# Patient Record
Sex: Male | Born: 1960 | Race: Black or African American | Hispanic: No | Marital: Married | Smoking: Never smoker
Health system: Southern US, Community
[De-identification: ages and names within clinical notes are randomized; demographics above are authoritative.]

## PROBLEM LIST (undated history)

## (undated) DIAGNOSIS — E119 Type 2 diabetes mellitus without complications: Secondary | ICD-10-CM

## (undated) DIAGNOSIS — G4733 Obstructive sleep apnea (adult) (pediatric): Secondary | ICD-10-CM

## (undated) DIAGNOSIS — A039 Shigellosis, unspecified: Secondary | ICD-10-CM

## (undated) DIAGNOSIS — K219 Gastro-esophageal reflux disease without esophagitis: Secondary | ICD-10-CM

## (undated) HISTORY — PX: OTHER SURGICAL HISTORY: SHX169

---

## 2001-07-23 ENCOUNTER — Encounter: Payer: Self-pay | Admitting: Internal Medicine

## 2001-07-23 ENCOUNTER — Emergency Department (HOSPITAL_COMMUNITY): Admission: EM | Admit: 2001-07-23 | Discharge: 2001-07-23 | Payer: Self-pay | Admitting: Emergency Medicine

## 2008-09-10 ENCOUNTER — Emergency Department (HOSPITAL_BASED_OUTPATIENT_CLINIC_OR_DEPARTMENT_OTHER): Admission: EM | Admit: 2008-09-10 | Discharge: 2008-09-10 | Payer: Self-pay | Admitting: Emergency Medicine

## 2010-04-30 ENCOUNTER — Encounter: Admission: RE | Admit: 2010-04-30 | Discharge: 2010-04-30 | Payer: Self-pay | Admitting: Internal Medicine

## 2011-04-02 NOTE — Consult Note (Signed)
[place]. [facility]  Patient:    Zachary Butler, Zachary Butler Visit Number: [id number] MRN: [id number]          Service Type: EMS Location: [facility] Attending Physician:  Osvaldo Human Dictated by:   Gloris Manchester. Lazarus Salines, M.D. Proc. Date: 07/23/01 Admit Date:  07/23/2001                            Consultation Report  CHIEF COMPLAINT:  Fish bone, right throat.  HISTORY OF PRESENT ILLNESS:  A 50 year old black male eating croaker last evening felt a bone lodge in his right throat.  He attempted to eat bread and similar to dislodge it without success.  Over the ensuing 24 hours, every time he swallows, it feels like something is poking him in his right throat.  He is a little bit scratchy in his voice but no breathing difficulty.  No blood.  No fever.  No prior similar events.  No recent upper respiratory infection or any reason to think he has a sore throat for other reasons.  He denies significant reflux.  He cannot feel it when he palpates externally but does point to the level of the hyoid, as describing where he thinks it is internally.  Slight referred neck and ear pain on the right side.  He is able to eat food and swallow liquids today without difficulty but does feel it, as above, every time he swallows.  PHYSICAL EXAMINATION:  GENERAL:  This is a healthy, cooperative, stocky, middle-aged black male in no distress.  The voice is slightly raspy.  He is not coughing or clearing his throat excessively during our interview.  Mental status is acute and appropriate.  He hears well in conversational speech.  Respiration is unlabored.  HEENT:  Head is atraumatic and neck supple.  Both ear canals are clear with aerated drums of normal configuration.  Cranial nerves intact.  Anterior nose showed slightly congested inferior turbinates but healthy mucosa and no active drainage.  Oral cavity shows teeth in good repair with some excess saliva. The oropharynx shows 2-3+  tonsils with slight cryptic material.  He has slight erythema of the posterior pharyngeal wall.  No asymmetry.  Soft palate normal. Could not see nasopharynx or hypopharynx secondary to gag.  NECK:  Muscular without adenopathy.  Normal laryngeal crepitus.  No tenderness with external palpation.  Following viscous Xylocaine topical anesthesia of the nose, the flexible laryngoscope was introduced.  The nasopharynx was clear with normal eustachian tori both sides.  Oropharynx clear with prominent tonsils and prominent base of tongue.  I could not easily see down into the valleculae, including with tongue protrusion.  The hypopharynx was fleshy.  Vocal cords are fully mobile with no erythema or asymmetry.  I see nothing in the endolarynx abnormal.  The epiglottis is clear.  The piriform sinuses are slightly narrow, consistent with his fleshy pharynx.  No pooling.  No fish bone or foreign body identified.  Attempted palpation to the base of tongue elicited a strong gag reflex and I was unable to get much farther back than the circumvallate papillae including no palpation of the tonsils.  X-ray:  A soft tissue lateral of the neck was read by the radiologist as consistent with foreign body but looks like they are once again observing the calcifications in the arytenoids and cricoid cartilage, typical with this gentlemans age.  IMPRESSION:  History consistent with a persistent fish bone foreign  body somewhere in the right oropharynx or hypopharynx.  The fact that he has a strong gag reflex, muscular tongue, and bulky lingual and faucial tonsil tissues could be hiding a fish bone easily that I could not detect here in the emergency room.  Following the numbing, the sensation of foreign body was slightly reduced, which may indicate that it is actually a mucosa scratch rather than a true foreign body.  Discussed the options with him.  He has had liquid to drink here within the last half and  hour, and so would have to wait several hours for n.p.o. status before considering for general anesthesia for direct laryngoscopy and esophagoscopy looking for a foreign body.  I think it is reasonable to give this some additional time to see if it settles down spontaneously.  It is now a quarter of 10 on Sunday night.  I will give him until 7 oclock Monday morning, at which point he will be fully n.p.o.  He will call me and, if he is still having a persistent sensation, then we will plan endoscopy tomorrow.  He will call me even if he is feeling better, so that we know that we have some closure on this issue.  If he is feeling well, I do not need to see him back. If he is still having trouble, we will take care of him. Dictated by:   Gloris Manchester. Lazarus Salines, M.D. Attending Physician:  Osvaldo Human DD:  07/23/01 TD:  07/24/01 Job: [id number]

## 2011-08-17 LAB — HIV RAPID SCREEN (BLD OR BODY FLD EXPOSURE): Rapid HIV Screen: NEGATIVE — AB

## 2011-08-17 LAB — HEPATITIS C ANTIBODY, REFLEX: HCV Ab: NEGATIVE

## 2011-08-17 LAB — HEPATITIS B SURFACE ANTIGEN: Hepatitis B Surface Ag: NEGATIVE

## 2014-08-09 ENCOUNTER — Ambulatory Visit: Payer: PRIVATE HEALTH INSURANCE

## 2014-08-09 ENCOUNTER — Other Ambulatory Visit: Payer: Self-pay | Admitting: Occupational Medicine

## 2014-08-09 ENCOUNTER — Ambulatory Visit
Admission: RE | Admit: 2014-08-09 | Discharge: 2014-08-09 | Disposition: A | Discharge status: Home / Self Care | Payer: PRIVATE HEALTH INSURANCE | Source: Ambulatory Visit | Attending: Occupational Medicine | Admitting: Occupational Medicine

## 2014-08-09 DIAGNOSIS — Z0000 Encounter for general adult medical examination without abnormal findings: Secondary | ICD-10-CM

## 2015-09-19 ENCOUNTER — Encounter (HOSPITAL_BASED_OUTPATIENT_CLINIC_OR_DEPARTMENT_OTHER): Payer: Self-pay

## 2015-09-19 ENCOUNTER — Observation Stay (HOSPITAL_BASED_OUTPATIENT_CLINIC_OR_DEPARTMENT_OTHER)
Admission: EM | Admit: 2015-09-19 | Discharge: 2015-09-23 | Disposition: A | Discharge status: Home / Self Care | Payer: Managed Care, Other (non HMO) | Attending: Surgery | Admitting: Surgery

## 2015-09-19 ENCOUNTER — Emergency Department (HOSPITAL_BASED_OUTPATIENT_CLINIC_OR_DEPARTMENT_OTHER): Payer: Managed Care, Other (non HMO)

## 2015-09-19 DIAGNOSIS — Z79899 Other long term (current) drug therapy: Secondary | ICD-10-CM | POA: Diagnosis not present

## 2015-09-19 DIAGNOSIS — R1084 Generalized abdominal pain: Secondary | ICD-10-CM | POA: Diagnosis present

## 2015-09-19 DIAGNOSIS — A033 Shigellosis due to Shigella sonnei: Secondary | ICD-10-CM | POA: Diagnosis not present

## 2015-09-19 DIAGNOSIS — R197 Diarrhea, unspecified: Secondary | ICD-10-CM

## 2015-09-19 DIAGNOSIS — G4733 Obstructive sleep apnea (adult) (pediatric): Secondary | ICD-10-CM | POA: Insufficient documentation

## 2015-09-19 DIAGNOSIS — K3589 Other acute appendicitis without perforation or gangrene: Secondary | ICD-10-CM

## 2015-09-19 DIAGNOSIS — R10813 Right lower quadrant abdominal tenderness: Secondary | ICD-10-CM

## 2015-09-19 DIAGNOSIS — K219 Gastro-esophageal reflux disease without esophagitis: Secondary | ICD-10-CM | POA: Insufficient documentation

## 2015-09-19 DIAGNOSIS — K529 Noninfective gastroenteritis and colitis, unspecified: Secondary | ICD-10-CM | POA: Diagnosis present

## 2015-09-19 HISTORY — DX: Gastro-esophageal reflux disease without esophagitis: K21.9

## 2015-09-19 HISTORY — DX: Obstructive sleep apnea (adult) (pediatric): G47.33

## 2015-09-19 LAB — CBC WITH DIFFERENTIAL/PLATELET
BASOS ABS: 0 10*3/uL (ref 0.0–0.1)
BASOS PCT: 0 %
Eosinophils Absolute: 0.1 10*3/uL (ref 0.0–0.7)
Eosinophils Relative: 1 %
HEMATOCRIT: 40.4 % (ref 39.0–52.0)
Hemoglobin: 13.5 g/dL (ref 13.0–17.0)
Lymphocytes Relative: 26 %
Lymphs Abs: 1.3 10*3/uL (ref 0.7–4.0)
MCH: 27.4 pg (ref 26.0–34.0)
MCHC: 33.4 g/dL (ref 30.0–36.0)
MCV: 82.1 fL (ref 78.0–100.0)
MONO ABS: 0.8 10*3/uL (ref 0.1–1.0)
Monocytes Relative: 17 %
NEUTROS ABS: 2.7 10*3/uL (ref 1.7–7.7)
Neutrophils Relative %: 56 %
Platelets: 160 10*3/uL (ref 150–400)
RBC: 4.92 MIL/uL (ref 4.22–5.81)
RDW: 13.9 % (ref 11.5–15.5)
WBC: 4.9 10*3/uL (ref 4.0–10.5)

## 2015-09-19 LAB — COMPREHENSIVE METABOLIC PANEL
ALBUMIN: 3.7 g/dL (ref 3.5–5.0)
ALT: 33 U/L (ref 17–63)
ANION GAP: 7 (ref 5–15)
AST: 20 U/L (ref 15–41)
Alkaline Phosphatase: 41 U/L (ref 38–126)
BILIRUBIN TOTAL: 0.6 mg/dL (ref 0.3–1.2)
BUN: 9 mg/dL (ref 6–20)
CO2: 28 mmol/L (ref 22–32)
Calcium: 8.6 mg/dL — ABNORMAL LOW (ref 8.9–10.3)
Chloride: 102 mmol/L (ref 101–111)
Creatinine, Ser: 0.88 mg/dL (ref 0.61–1.24)
GFR calc non Af Amer: >60 mL/min (ref >60)
GLUCOSE: 116 mg/dL — AB (ref 65–99)
Potassium: 3.3 mmol/L — ABNORMAL LOW (ref 3.5–5.1)
Sodium: 137 mmol/L (ref 135–145)
TOTAL PROTEIN: 7.3 g/dL (ref 6.5–8.1)

## 2015-09-19 LAB — C DIFFICILE QUICK SCREEN W PCR REFLEX
C DIFFICLE (CDIFF) ANTIGEN: UNDETERMINED — AB
C Diff toxin: UNDETERMINED — AB

## 2015-09-19 LAB — CLOSTRIDIUM DIFFICILE BY PCR: CDIFFPCR: NEGATIVE

## 2015-09-19 LAB — LIPASE, BLOOD: Lipase: 22 U/L (ref 11–51)

## 2015-09-19 MED ORDER — IOHEXOL 300 MG/ML  SOLN
100.0000 mL | Freq: Once | INTRAMUSCULAR | Status: AC | PRN
  Administered 2015-09-19: 100 mL via INTRAVENOUS

## 2015-09-19 MED ORDER — ONDANSETRON HCL 4 MG/2ML IJ SOLN
4.0000 mg | Freq: Once | INTRAMUSCULAR | Status: AC
  Administered 2015-09-19: 4 mg via INTRAVENOUS
  Filled 2015-09-19: qty 2

## 2015-09-19 MED ORDER — POTASSIUM CHLORIDE IN NACL 40-0.9 MEQ/L-% IV SOLN
INTRAVENOUS | Status: DC
  Administered 2015-09-19 – 2015-09-20 (×2): 125 mL/h via INTRAVENOUS
  Filled 2015-09-19 (×4): qty 1000

## 2015-09-19 MED ORDER — IBUPROFEN 200 MG PO TABS
600.0000 mg | ORAL_TABLET | Freq: Four times a day (QID) | ORAL | Status: DC | PRN
  Administered 2015-09-20 – 2015-09-23 (×8): 600 mg via ORAL
  Filled 2015-09-19 (×8): qty 3

## 2015-09-19 MED ORDER — ONDANSETRON HCL 4 MG/2ML IJ SOLN: 4.0000 mg | Freq: Four times a day (QID) | INTRAMUSCULAR | Status: DC | PRN

## 2015-09-19 MED ORDER — SODIUM CHLORIDE 0.9 % IV BOLUS (SEPSIS)
1000.0000 mL | Freq: Once | INTRAVENOUS | Status: AC
  Administered 2015-09-19: 1000 mL via INTRAVENOUS

## 2015-09-19 MED ORDER — DIPHENHYDRAMINE HCL 50 MG/ML IJ SOLN: 25.0000 mg | Freq: Four times a day (QID) | INTRAMUSCULAR | Status: DC | PRN

## 2015-09-19 MED ORDER — MORPHINE SULFATE (PF) 4 MG/ML IV SOLN
4.0000 mg | Freq: Once | INTRAVENOUS | Status: AC
  Administered 2015-09-19: 4 mg via INTRAVENOUS
  Filled 2015-09-19: qty 1

## 2015-09-19 MED ORDER — PIPERACILLIN-TAZOBACTAM 3.375 G IVPB 30 MIN
3.3750 g | Freq: Once | INTRAVENOUS | Status: AC
  Administered 2015-09-19: 3.375 g via INTRAVENOUS
  Filled 2015-09-19 (×2): qty 50

## 2015-09-19 MED ORDER — HEPARIN SODIUM (PORCINE) 5000 UNIT/ML IJ SOLN
5000.0000 [IU] | Freq: Three times a day (TID) | INTRAMUSCULAR | Status: AC
  Administered 2015-09-19 (×2): 5000 [IU] via SUBCUTANEOUS
  Filled 2015-09-19 (×2): qty 1

## 2015-09-19 MED ORDER — DIPHENHYDRAMINE HCL 25 MG PO CAPS: 25.0000 mg | ORAL_CAPSULE | Freq: Four times a day (QID) | ORAL | Status: DC | PRN

## 2015-09-19 MED ORDER — IOHEXOL 300 MG/ML  SOLN
25.0000 mL | Freq: Once | INTRAMUSCULAR | Status: AC | PRN
  Administered 2015-09-19: 25 mL via ORAL

## 2015-09-19 MED ORDER — ONDANSETRON 4 MG PO TBDP
4.0000 mg | ORAL_TABLET | Freq: Four times a day (QID) | ORAL | Status: DC | PRN
  Administered 2015-09-21 (×3): 4 mg via ORAL
  Filled 2015-09-19 (×3): qty 1

## 2015-09-19 MED ORDER — MORPHINE SULFATE (PF) 2 MG/ML IV SOLN
1.0000 mg | INTRAVENOUS | Status: DC | PRN
Start: 2015-09-19 — End: 2015-09-20
  Administered 2015-09-19: 2 mg via INTRAVENOUS
  Filled 2015-09-19: qty 1

## 2015-09-19 NOTE — ED Notes (Signed)
Pt's wife came out to nurse's desk asking how much longer before surgeon to see pt.   [place] and [place].  Surgeon is currently in OR then has one case before this patient.  Andrey CampanileSandy believes that MD will come to see patient after current case and before he starts that case before this pt, approx 40 mins.  Made pt's wife aware.

## 2015-09-19 NOTE — ED Notes (Signed)
Bed: WA17 Expected date:  Expected time:  Means of arrival:  Comments: Ems  

## 2015-09-19 NOTE — ED Notes (Signed)
Pt reports Tuesday night, he developed generalized abdominal cramping, nausea, diarrhea, headache, fever. Denies sick contacts.

## 2015-09-19 NOTE — ED Notes (Signed)
Pt is aware of need for stool sample. 

## 2015-09-19 NOTE — H&P (Signed)
Zachary Butler is an 54 y.o. male.   Chief Complaint:  Generalized abdominal cramping, nausea, diarrhea, headache and fever symptoms started on 09/16/15. HPI:  Pt presents to [facility] with what they thought was an gastroenteritis, no one else in the family is sick.  He has been this way since 09/16/15.   No fever, VSS.  WBC is normal 4.9, no left shift. K+ is low, but otherwise normal CMP.  CT scan shows:  The appendix originates from the medial aspect of the cecum and demonstrates thickening with maximal proximal caliber of 11 mm and tapering down to approximately 8 mm distally. No significant surrounding inflammatory changes or evidence of focal abscess. No extraluminal air identified. Findings likely represent early appendicitis.  He is transferred to [facility] for further evaluation and treatment. Currently he feels fine, but is still tender RLQ.  Past Medical History  Diagnosis Date  . OSA (obstructive sleep apnea)   . GERD (gastroesophageal reflux disease)     History reviewed. No pertinent past surgical history.  History reviewed. No pertinent family history. Social History:  reports that he has never smoked. He does not have any smokeless tobacco history on file. He reports that he does not drink alcohol or use illicit drugs.  Allergies: No Known Allergies  Prior to Admission medications   Medication Sig Start Date End Date Taking? Authorizing Provider  OMEPRAZOLE PO Take by mouth.   Yes Historical Provider, MD     Results for orders placed or performed during the hospital encounter of 09/19/15 (from the past 48 hour(s))  Comprehensive metabolic panel     Status: Abnormal   Collection Time: 09/19/15  9:35 AM  Result Value Ref Range   Sodium 137 135 - 145 mmol/L   Potassium 3.3 (L) 3.5 - 5.1 mmol/L   Chloride 102 101 - 111 mmol/L   CO2 28 22 - 32 mmol/L   Glucose, Bld 116 (H) 65 - 99 mg/dL   BUN 9 6 - 20 mg/dL   Creatinine, Ser 0.88 0.61 - 1.24 mg/dL   Calcium 8.6 (L) 8.9 - 10.3 mg/dL    Total Protein 7.3 6.5 - 8.1 g/dL   Albumin 3.7 3.5 - 5.0 g/dL   AST 20 15 - 41 U/L   ALT 33 17 - 63 U/L   Alkaline Phosphatase 41 38 - 126 U/L   Total Bilirubin 0.6 0.3 - 1.2 mg/dL   GFR calc non Af Amer >60 >60 mL/min   GFR calc Af Amer >60 >60 mL/min    Comment: (NOTE) The eGFR has been calculated using the CKD EPI equation. This calculation has not been validated in all clinical situations. eGFR's persistently <60 mL/min signify possible Chronic Kidney Disease.    Anion gap 7 5 - 15  CBC WITH DIFFERENTIAL     Status: None   Collection Time: 09/19/15  9:35 AM  Result Value Ref Range   WBC 4.9 4.0 - 10.5 K/uL   RBC 4.92 4.22 - 5.81 MIL/uL   Hemoglobin 13.5 13.0 - 17.0 g/dL   HCT 40.4 39.0 - 52.0 %   MCV 82.1 78.0 - 100.0 fL   MCH 27.4 26.0 - 34.0 pg   MCHC 33.4 30.0 - 36.0 g/dL   RDW 13.9 11.5 - 15.5 %   Platelets 160 150 - 400 K/uL   Neutrophils Relative % 56 %   Neutro Abs 2.7 1.7 - 7.7 K/uL   Lymphocytes Relative 26 %   Lymphs Abs 1.3 0.7 - 4.0  K/uL   Monocytes Relative 17 %   Monocytes Absolute 0.8 0.1 - 1.0 K/uL   Eosinophils Relative 1 %   Eosinophils Absolute 0.1 0.0 - 0.7 K/uL   Basophils Relative 0 %   Basophils Absolute 0.0 0.0 - 0.1 K/uL  Lipase, blood     Status: None   Collection Time: 09/19/15  9:35 AM  Result Value Ref Range   Lipase 22 11 - 51 U/L   Ct Abdomen Pelvis W Contrast  09/19/2015  CLINICAL DATA:  Right lower quadrant abdominal pain for 3 days with nausea and diarrhea. EXAM: CT ABDOMEN AND PELVIS WITH CONTRAST TECHNIQUE: Multidetector CT imaging of the abdomen and pelvis was performed using the standard protocol following bolus administration of intravenous contrast. CONTRAST:  43m OMNIPAQUE IOHEXOL 300 MG/ML SOLN, 1041mOMNIPAQUE IOHEXOL 300 MG/ML SOLN COMPARISON:  None. FINDINGS: The appendix originates from the medial aspect of the cecum and demonstrates thickening with maximal proximal caliber of 11 mm and tapering down to approximately 8  mm distally. No significant surrounding inflammatory changes or evidence of focal abscess. No extraluminal air identified. Findings likely represent early appendicitis. No bowel obstruction identified. No free air. The liver, gallbladder, pancreas, spleen, adrenal glands and kidneys are within normal limits. No masses, enlarged lymph nodes or hernias are seen. No vascular abnormalities identified. Bony structures are unremarkable. Visualized lung bases are unremarkable and show mild atelectasis in the right middle lobe. IMPRESSION: Enlarged appendix measuring 11 mm in greatest caliber. Although no significant surrounding inflammatory changes are seen, findings likely represent early appendicitis based on abnormal caliber of the appendix. Electronically Signed   By: GlAletta Edouard.D.   On: 09/19/2015 11:00    Review of Systems  Constitutional: Positive for weight loss (not sure how much). Negative for fever, chills, malaise/fatigue and diaphoresis.       He felt hot, but never took temp.  HENT: Negative.   Eyes: Negative.   Respiratory: Negative.        [facility]  Cardiovascular: Negative.   Gastrointestinal: Positive for heartburn, nausea, vomiting, abdominal pain (Pain is in RLQ) and diarrhea. Negative for constipation, blood in stool and melena.  Genitourinary: Negative.   Musculoskeletal: Negative.   Skin: Negative.   Neurological: Negative.  Negative for weakness.  Endo/Heme/Allergies: Negative.   Psychiatric/Behavioral: Negative.        Veteran being evaluated for PTSD like issues.    Blood pressure 132/79, pulse 68, temperature 99.2 F (37.3 C), temperature source Oral, resp. rate 18, height 6' 1"  (1.854 m), weight 102.059 kg (225 lb), SpO2 97 %. Physical Exam  Constitutional: He is oriented to person, place, and time. He appears well-developed and well-nourished. No distress.  HENT:  Head: Normocephalic and atraumatic.  Nose: Nose normal.  Eyes:  Conjunctivae and EOM are normal. Right eye exhibits no discharge. Left eye exhibits no discharge. No scleral icterus.  Neck: Normal range of motion. Neck supple. No JVD present. No tracheal deviation present. No thyromegaly present.  Cardiovascular: Normal rate, regular rhythm, normal heart sounds and intact distal pulses.   No murmur heard. Respiratory: Effort normal and breath sounds normal. No respiratory distress. He has no wheezes. He has no rales. He exhibits no tenderness.  GI: Soft. Bowel sounds are normal. He exhibits no distension and no mass. There is tenderness (RLQ). There is no rebound and no guarding.  Musculoskeletal: He exhibits no edema or tenderness.  Lymphadenopathy:    He has no cervical  adenopathy.  Neurological: He is alert and oriented to person, place, and time. No cranial nerve deficit.  Skin: Skin is warm and dry. No rash noted. He is not diaphoretic. No erythema. No pallor.  Psychiatric: He has a normal mood and affect. His behavior is normal. Judgment and thought content normal.     Assessment/Plan Appendicitis vs gastroenteritis OSA GERD  PLAN:  Admit to observation, hydrate overnight, clears for now, NPO after MN. Reevaluate in AM, for possible appendectomy.  I have ordered C diff and stool cultures because he is still having diarrhea.  He had one dose of Zosyn, but I have not ordered more at this point until I have a better diagnosis.  Kenyonna Micek 09/19/2015, 11:26 AM

## 2015-09-19 NOTE — ED Provider Notes (Signed)
CSN: [id number]     Arrival date & time 09/19/15  4098 History   First MD Initiated Contact with Patient 09/19/15 [phone]     Chief Complaint  Patient presents with  . Abdominal Pain     (Consider location/radiation/quality/duration/timing/severity/associated sxs/prior Treatment) HPI Comments: Alka seltzer and tums put to sleep, did not help   Patient is a 54 y.o. male presenting with abdominal pain.  Abdominal Pain Pain location:  Generalized Pain quality: cramping   Pain radiates to:  Does not radiate Pain severity:  Severe Onset quality:  Gradual Duration:  4 days Timing:  Constant Progression:  Worsening Chronicity:  New Context: not sick contacts   Associated symptoms: diarrhea (every hour or more since tuesday, no recent abx ), fatigue, fever (subjective) and nausea (improved)   Associated symptoms: no chest pain, no constipation, no cough, no dysuria, no melena, no shortness of breath, no sore throat and no vomiting   Risk factors comment:  No recent abx   Past Medical History  Diagnosis Date  . OSA (obstructive sleep apnea)   . GERD (gastroesophageal reflux disease)    History reviewed. No pertinent past surgical history. History reviewed. No pertinent family history. Social History  Substance Use Topics  . Smoking status: Never Smoker   . Smokeless tobacco: None  . Alcohol Use: No    Review of Systems  Constitutional: Positive for fever (subjective) and fatigue. Negative for appetite change.  HENT: Negative for sore throat.   Eyes: Negative for visual disturbance.  Respiratory: Negative for cough and shortness of breath.   Cardiovascular: Negative for chest pain.  Gastrointestinal: Positive for nausea (improved), abdominal pain and diarrhea (every hour or more since tuesday, no recent abx ). Negative for vomiting, constipation and melena.  Genitourinary: Negative for dysuria and difficulty urinating.  Musculoskeletal: Negative for back pain and neck  stiffness.  Skin: Negative for rash.  Neurological: Positive for headaches. Negative for syncope, speech difficulty, weakness and numbness.      Allergies  Review of patient's allergies indicates no known allergies.  Home Medications   Prior to Admission medications   Medication Sig Start Date End Date Taking? Authorizing Provider  cyclobenzaprine (FLEXERIL) 10 MG tablet Take 10 mg by mouth 3 (three) times daily as needed for muscle spasms.   Yes Historical Provider, MD  ergocalciferol (VITAMIN D2) 50000 UNITS capsule Take 50,000 Units by mouth once a week. Tuesday   Yes Historical Provider, MD  methocarbamol (ROBAXIN) 500 MG tablet Take 1 tablet by mouth daily as needed. spasm 07/30/15  Yes Historical Provider, MD  Phenyleph-Doxylamine-DM-APAP (ALKA SELTZER PLUS PO) Take 1 tablet by mouth daily as needed (stomach discomfort).   Yes Historical Provider, MD   BP 123/75 mmHg  Pulse 53  Temp(Src) 98.1 F (36.7 C) (Oral)  Resp 18  Ht  (1.854 m)  Wt 225 lb (102.059 kg)  BMI 29.69 kg/m2  SpO2 98% Physical Exam  Constitutional: He is oriented to person, place, and time. He appears well-developed and well-nourished. No distress.  HENT:  Head: Normocephalic and atraumatic.  Eyes: Conjunctivae and EOM are normal.  Neck: Normal range of motion.  Cardiovascular: Normal rate, regular rhythm, normal heart sounds and intact distal pulses.  Exam reveals no gallop and no friction rub.   No murmur heard. Pulmonary/Chest: Effort normal and breath sounds normal. No respiratory distress. He has no wheezes. He has no rales.  Abdominal: Soft. He exhibits no distension. There is tenderness (RLQ). There is no guarding.  Musculoskeletal: He exhibits no edema.  Neurological: He is alert and oriented to person, place, and time.  Skin: Skin is warm and dry. He is not diaphoretic.  Nursing note and vitals reviewed.   ED Course  Procedures (including critical care time) Labs Review Labs Reviewed   C DIFFICILE QUICK SCREEN W PCR REFLEX - Abnormal; Notable for the following:    C Diff antigen INDETERMINATE (*)    C Diff toxin INDETERMINATE (*)    All other components within normal limits  COMPREHENSIVE METABOLIC PANEL - Abnormal; Notable for the following:    Potassium 3.3 (*)    Glucose, Bld 116 (*)    Calcium 8.6 (*)    All other components within normal limits  COMPREHENSIVE METABOLIC PANEL - Abnormal; Notable for the following:    Glucose, Bld 106 (*)    Calcium 8.6 (*)    Total Protein 6.4 (*)    Albumin 3.2 (*)    Alkaline Phosphatase 36 (*)    All other components within normal limits  CBC - Abnormal; Notable for the following:    Hemoglobin 11.9 (*)    HCT 36.2 (*)    Platelets 138 (*)    All other components within normal limits  CLOSTRIDIUM DIFFICILE BY PCR  STOOL CULTURE  CBC WITH DIFFERENTIAL/PLATELET  LIPASE, BLOOD    Imaging Review Ct Abdomen Pelvis W Contrast  09/19/2015  CLINICAL DATA:  Right lower quadrant abdominal pain for 3 days with nausea and diarrhea. EXAM: CT ABDOMEN AND PELVIS WITH CONTRAST TECHNIQUE: Multidetector CT imaging of the abdomen and pelvis was performed using the standard protocol following bolus administration of intravenous contrast. CONTRAST:  25mL OMNIPAQUE IOHEXOL 300 MG/ML SOLN, 100mL OMNIPAQUE IOHEXOL 300 MG/ML SOLN COMPARISON:  None. FINDINGS: The appendix originates from the medial aspect of the cecum and demonstrates thickening with maximal proximal caliber of 11 mm and tapering down to approximately 8 mm distally. No significant surrounding inflammatory changes or evidence of focal abscess. No extraluminal air identified. Findings likely represent early appendicitis. No bowel obstruction identified. No free air. The liver, gallbladder, pancreas, spleen, adrenal glands and kidneys are within normal limits. No masses, enlarged lymph nodes or hernias are seen. No vascular abnormalities identified. Bony structures are unremarkable.  Visualized lung bases are unremarkable and show mild atelectasis in the right middle lobe. IMPRESSION: Enlarged appendix measuring 11 mm in greatest caliber. Although no significant surrounding inflammatory changes are seen, findings likely represent early appendicitis based on abnormal caliber of the appendix. Electronically Signed   By: Irish LackGlenn  Yamagata M.D.   On: 09/19/2015 11:00   I have personally reviewed and evaluated these images and lab results as part of my medical decision-making.   EKG Interpretation None      MDM   Final diagnoses:  Diarrhea, unspecified type  Other acute appendicitis, enlarged appendix 11mm  Right lower quadrant abdominal tenderness   54yo male with history of GERD, OSA presents with concern for diarrhea and abdominal pain.  History most consistent with likely viral gastroenteritis, however patient with specific RLQ tenderness on exam and CT abd/pelvis ordered to evaluate for appendicitis.  CT shows enlarged appendix, however no other inflammatory findings. Given this RLQ tenderness and enlarged appendix, pt given zosyn and surgery consulted for possible early appendicitis. [facility] and care.    Alvira MondayErin Addie Cederberg, MD 09/20/15 1252

## 2015-09-20 ENCOUNTER — Encounter (HOSPITAL_COMMUNITY)
Admission: EM | Disposition: A | Discharge status: Home / Self Care | Payer: Self-pay | Source: Home / Self Care | Attending: Emergency Medicine

## 2015-09-20 LAB — CBC
HCT: 36.2 % — ABNORMAL LOW (ref 39.0–52.0)
Hemoglobin: 11.9 g/dL — ABNORMAL LOW (ref 13.0–17.0)
MCH: 27.2 pg (ref 26.0–34.0)
MCHC: 32.9 g/dL (ref 30.0–36.0)
MCV: 82.8 fL (ref 78.0–100.0)
PLATELETS: 138 10*3/uL — AB (ref 150–400)
RBC: 4.37 MIL/uL (ref 4.22–5.81)
RDW: 13.6 % (ref 11.5–15.5)
WBC: 4.7 10*3/uL (ref 4.0–10.5)

## 2015-09-20 LAB — COMPREHENSIVE METABOLIC PANEL
ALBUMIN: 3.2 g/dL — AB (ref 3.5–5.0)
ALT: 27 U/L (ref 17–63)
AST: 16 U/L (ref 15–41)
Alkaline Phosphatase: 36 U/L — ABNORMAL LOW (ref 38–126)
Anion gap: 6 (ref 5–15)
BILIRUBIN TOTAL: 0.5 mg/dL (ref 0.3–1.2)
BUN: 6 mg/dL (ref 6–20)
CO2: 27 mmol/L (ref 22–32)
CREATININE: 0.74 mg/dL (ref 0.61–1.24)
Calcium: 8.6 mg/dL — ABNORMAL LOW (ref 8.9–10.3)
Chloride: 107 mmol/L (ref 101–111)
GFR calc Af Amer: >60 mL/min (ref >60)
GFR calc non Af Amer: >60 mL/min (ref >60)
Glucose, Bld: 106 mg/dL — ABNORMAL HIGH (ref 65–99)
POTASSIUM: 3.9 mmol/L (ref 3.5–5.1)
Sodium: 140 mmol/L (ref 135–145)
TOTAL PROTEIN: 6.4 g/dL — AB (ref 6.5–8.1)

## 2015-09-20 SURGERY — APPENDECTOMY, LAPAROSCOPIC
Anesthesia: General

## 2015-09-20 MED ORDER — MAGIC MOUTHWASH
15.0000 mL | Freq: Four times a day (QID) | ORAL | Status: DC | PRN
  Filled 2015-09-20: qty 15

## 2015-09-20 MED ORDER — BISMUTH SUBSALICYLATE 262 MG/15ML PO SUSP
30.0000 mL | Freq: Two times a day (BID) | ORAL | Status: DC
  Administered 2015-09-21: 30 mL via ORAL
  Filled 2015-09-20: qty 118

## 2015-09-20 MED ORDER — SACCHAROMYCES BOULARDII 250 MG PO CAPS
250.0000 mg | ORAL_CAPSULE | Freq: Two times a day (BID) | ORAL | Status: DC
  Administered 2015-09-20 – 2015-09-23 (×7): 250 mg via ORAL
  Filled 2015-09-20 (×9): qty 1

## 2015-09-20 MED ORDER — LIP MEDEX EX OINT
1.0000 "application " | TOPICAL_OINTMENT | Freq: Two times a day (BID) | CUTANEOUS | Status: DC
  Administered 2015-09-20 – 2015-09-23 (×6): 1 via TOPICAL
  Filled 2015-09-20: qty 7

## 2015-09-20 MED ORDER — PROMETHAZINE HCL 25 MG/ML IJ SOLN
6.2500 mg | INTRAMUSCULAR | Status: DC | PRN
Start: 2015-09-20 — End: 2015-09-23

## 2015-09-20 MED ORDER — SODIUM CHLORIDE 0.9 % IJ SOLN: 3.0000 mL | Freq: Two times a day (BID) | INTRAMUSCULAR | Status: DC

## 2015-09-20 MED ORDER — BISMUTH SUBSALICYLATE 262 MG/15ML PO SUSP
30.0000 mL | Freq: Three times a day (TID) | ORAL | Status: DC | PRN
  Filled 2015-09-20: qty 118

## 2015-09-20 MED ORDER — MORPHINE SULFATE (PF) 2 MG/ML IV SOLN: 2.0000 mg | INTRAVENOUS | Status: DC | PRN

## 2015-09-20 MED ORDER — PSYLLIUM 95 % PO PACK
1.0000 | PACK | Freq: Two times a day (BID) | ORAL | Status: DC
  Administered 2015-09-22 – 2015-09-23 (×2): 1 via ORAL
  Filled 2015-09-20 (×8): qty 1

## 2015-09-20 MED ORDER — METHOCARBAMOL 1000 MG/10ML IJ SOLN
1000.0000 mg | Freq: Four times a day (QID) | INTRAVENOUS | Status: DC | PRN
  Filled 2015-09-20: qty 10

## 2015-09-20 MED ORDER — MENTHOL 3 MG MT LOZG
1.0000 | LOZENGE | OROMUCOSAL | Status: DC | PRN
  Filled 2015-09-20: qty 9

## 2015-09-20 MED ORDER — LACTATED RINGERS IV BOLUS (SEPSIS): 1000.0000 mL | Freq: Three times a day (TID) | INTRAVENOUS | Status: AC | PRN

## 2015-09-20 MED ORDER — PHENOL 1.4 % MT LIQD
2.0000 | OROMUCOSAL | Status: DC | PRN
  Filled 2015-09-20: qty 177

## 2015-09-20 MED ORDER — SODIUM CHLORIDE 0.9 % IV SOLN: 250.0000 mL | INTRAVENOUS | Status: DC | PRN

## 2015-09-20 MED ORDER — ALUM & MAG HYDROXIDE-SIMETH 200-200-20 MG/5ML PO SUSP: 30.0000 mL | Freq: Four times a day (QID) | ORAL | Status: DC | PRN

## 2015-09-20 MED ORDER — CYCLOBENZAPRINE HCL 10 MG PO TABS
10.0000 mg | ORAL_TABLET | Freq: Three times a day (TID) | ORAL | Status: DC | PRN
  Administered 2015-09-20 – 2015-09-23 (×7): 10 mg via ORAL
  Filled 2015-09-20 (×7): qty 1

## 2015-09-20 MED ORDER — LACTATED RINGERS IV BOLUS (SEPSIS)
2000.0000 mL | Freq: Once | INTRAVENOUS | Status: AC
  Administered 2015-09-21: 2000 mL via INTRAVENOUS

## 2015-09-20 MED ORDER — SODIUM CHLORIDE 0.9 % IJ SOLN: 3.0000 mL | INTRAMUSCULAR | Status: DC | PRN

## 2015-09-20 MED ORDER — METHOCARBAMOL 500 MG PO TABS
1000.0000 mg | ORAL_TABLET | Freq: Four times a day (QID) | ORAL | Status: DC | PRN
Start: 2015-09-20 — End: 2015-09-23

## 2015-09-20 NOTE — Progress Notes (Signed)
Shift progressed uneventfully. Patient slept well overnight and denied any pain. Vital signs remained stable.   

## 2015-09-20 NOTE — Progress Notes (Signed)
[address] [place], [place] [address] Phone: [phone] FAX: [phone]   Zachary Butler [id number] 01-Oct-1961   Problem List:   Active Problems:   Gastroenteritis       Assessment  Not worse w only diarrhea left = c/w viral gastroenteritis  Plan:  Given the fact that he does not have significant abdominal pain off antibiotics and not requiring narcotics through the night, that argues against appendicitis.  He does have soreness in his right lower quadrant.  He also has it in the epigastric region and other areas.  Not localized.  It seems most consistent with gastritis to me.  I think it reasonable to test [facility] and give him a by mouth trial since he tells me he is starting.  Try to gently control his diarrhea with Pepto since his C. difficile toxin is negative.  If he has worsening abdominal pain or nausea vomiting or the pain becomes more focused in the right lower quadrant, consider diagnostic laparoscopy with appendectomy.  Otherwise we will hold off for now since his numbers are worse and his abdominal exam is better.  His wife agree.  If he has worsening diarrhea that does not resolve these interactions, may require more aggressive workup and gastroenterology consultation.  Try and hold off for now since suspicion of other etiologies is not too high at this point.  -IVF -PO trial -bowel regimen -VTE prophylaxis- SCDs, etc -mobilize as tolerated to help recovery  Zachary Butler, M.D., F.A.C.S. Gastrointestinal and Minimally Invasive Surgery [facility] [address] [place], [place] [address] [phone] Main / Paging   09/20/2015  Subjective:  Starving.  Asking for food  A lot of loose bowel movements.  Feels like he is cleaned out now.  Some abdominal soreness but mostly in upper abdomen and right side.  Wife in room.  Objective:  Vital signs:  Filed  Vitals:   09/19/15 1702 09/19/15 1756 09/19/15 2200 09/20/15 0304  BP: 129/70 130/78 117/69 132/76  Pulse: 62 63 61 67  Temp:  98.3 F (36.8 C) 98.3 F (36.8 C) 98.2 F (36.8 C)  TempSrc:  Oral Oral Oral  Resp: 18 20 18 16   Height:      Weight:      SpO2: 98% 99% 96% 98%    Last BM Date: 09/19/15  Intake/Output   Yesterday:  11/04 0701 - 11/05 0700 In: 1120 [P.O.:120; I.V.:1000] Out: 2 [Urine:2] This shift:  Total I/O In: 1120 [P.O.:120; I.V.:1000] Out: 2 [Urine:2]  Bowel function:  Flatus: y  BM: many loose BMs  Drain: n/a  Physical Exam:  General: Pt awake/alert/oriented x4 in no acute distress Eyes: PERRL, normal EOM.  Sclera clear.  No icterus Neuro: CN II-XII intact w/o focal sensory/motor deficits. Lymph: No head/neck/groin lymphadenopathy Psych:  No delerium/psychosis/paranoia HENT: Normocephalic, Mucus membranes moist.  No thrush Neck: Supple, No tracheal deviation Chest: No chest wall pain w good excursion CV:  Pulses intact.  Regular rhythm MS: Normal AROM mjr joints.  No obvious deformity Abdomen: Mildy firm at first but then soft.  Nondistended.  Mildly tender at epigastric = RLQ > RUQ.  No pain with cough or bed shake.  No guarding.  No evidence of peritonitis.  No incarcerated hernias. Ext:  SCDs BLE.  No mjr edema.  No cyanosis Skin: No petechiae / purpura  Results:   Labs: Results for orders placed or performed during the hospital  encounter of 09/19/15 (from the past 48 hour(s))  Comprehensive metabolic panel     Status: Abnormal   Collection Time: 09/19/15  9:35 AM  Result Value Ref Range   Sodium 137 135 - 145 mmol/L   Potassium 3.3 (L) 3.5 - 5.1 mmol/L   Chloride 102 101 - 111 mmol/L   CO2 28 22 - 32 mmol/L   Glucose, Bld 116 (H) 65 - 99 mg/dL   BUN 9 6 - 20 mg/dL   Creatinine, Ser 0.88 0.61 - 1.24 mg/dL   Calcium 8.6 (L) 8.9 - 10.3 mg/dL   Total Protein 7.3 6.5 - 8.1 g/dL   Albumin 3.7 3.5 - 5.0 g/dL   AST 20 15 - 41 U/L   ALT 33  17 - 63 U/L   Alkaline Phosphatase 41 38 - 126 U/L   Total Bilirubin 0.6 0.3 - 1.2 mg/dL   GFR calc non Af Amer >60 >60 mL/min   GFR calc Af Amer >60 >60 mL/min    Comment: (NOTE) The eGFR has been calculated using the CKD EPI equation. This calculation has not been validated in all clinical situations. eGFR's persistently <60 mL/min signify possible Chronic Kidney Disease.    Anion gap 7 5 - 15  CBC WITH DIFFERENTIAL     Status: None   Collection Time: 09/19/15  9:35 AM  Result Value Ref Range   WBC 4.9 4.0 - 10.5 K/uL   RBC 4.92 4.22 - 5.81 MIL/uL   Hemoglobin 13.5 13.0 - 17.0 g/dL   HCT 40.4 39.0 - 52.0 %   MCV 82.1 78.0 - 100.0 fL   MCH 27.4 26.0 - 34.0 pg   MCHC 33.4 30.0 - 36.0 g/dL   RDW 13.9 11.5 - 15.5 %   Platelets 160 150 - 400 K/uL   Neutrophils Relative % 56 %   Neutro Abs 2.7 1.7 - 7.7 K/uL   Lymphocytes Relative 26 %   Lymphs Abs 1.3 0.7 - 4.0 K/uL   Monocytes Relative 17 %   Monocytes Absolute 0.8 0.1 - 1.0 K/uL   Eosinophils Relative 1 %   Eosinophils Absolute 0.1 0.0 - 0.7 K/uL   Basophils Relative 0 %   Basophils Absolute 0.0 0.0 - 0.1 K/uL  Lipase, blood     Status: None   Collection Time: 09/19/15  9:35 AM  Result Value Ref Range   Lipase 22 11 - 51 U/L  C difficile quick scan w PCR reflex     Status: Abnormal   Collection Time: 09/19/15  4:46 PM  Result Value Ref Range   C Diff antigen INDETERMINATE (A) NEGATIVE   C Diff toxin INDETERMINATE (A) NEGATIVE   C Diff interpretation Results are indeterminate. See PCR results.   Clostridium Difficile by PCR     Status: None   Collection Time: 09/19/15  4:46 PM  Result Value Ref Range   Toxigenic C Difficile by pcr NEGATIVE NEGATIVE  Comprehensive metabolic panel     Status: Abnormal   Collection Time: 09/20/15  5:26 AM  Result Value Ref Range   Sodium 140 135 - 145 mmol/L   Potassium 3.9 3.5 - 5.1 mmol/L   Chloride 107 101 - 111 mmol/L   CO2 27 22 - 32 mmol/L   Glucose, Bld 106 (H) 65 - 99 mg/dL    BUN 6 6 - 20 mg/dL   Creatinine, Ser 0.74 0.61 - 1.24 mg/dL   Calcium 8.6 (L) 8.9 - 10.3 mg/dL   Total Protein 6.4 (  L) 6.5 - 8.1 g/dL   Albumin 3.2 (L) 3.5 - 5.0 g/dL   AST 16 15 - 41 U/L   ALT 27 17 - 63 U/L   Alkaline Phosphatase 36 (L) 38 - 126 U/L   Total Bilirubin 0.5 0.3 - 1.2 mg/dL   GFR calc non Af Amer >60 >60 mL/min   GFR calc Af Amer >60 >60 mL/min    Comment: (NOTE) The eGFR has been calculated using the CKD EPI equation. This calculation has not been validated in all clinical situations. eGFR's persistently <60 mL/min signify possible Chronic Kidney Disease.    Anion gap 6 5 - 15  CBC     Status: Abnormal   Collection Time: 09/20/15  5:26 AM  Result Value Ref Range   WBC 4.7 4.0 - 10.5 K/uL   RBC 4.37 4.22 - 5.81 MIL/uL   Hemoglobin 11.9 (L) 13.0 - 17.0 g/dL   HCT 36.2 (L) 39.0 - 52.0 %   MCV 82.8 78.0 - 100.0 fL   MCH 27.2 26.0 - 34.0 pg   MCHC 32.9 30.0 - 36.0 g/dL   RDW 13.6 11.5 - 15.5 %   Platelets 138 (L) 150 - 400 K/uL    Imaging / Studies: Ct Abdomen Pelvis W Contrast  09/19/2015  CLINICAL DATA:  Right lower quadrant abdominal pain for 3 days with nausea and diarrhea. EXAM: CT ABDOMEN AND PELVIS WITH CONTRAST TECHNIQUE: Multidetector CT imaging of the abdomen and pelvis was performed using the standard protocol following bolus administration of intravenous contrast. CONTRAST:  39m OMNIPAQUE IOHEXOL 300 MG/ML SOLN, 1041mOMNIPAQUE IOHEXOL 300 MG/ML SOLN COMPARISON:  None. FINDINGS: The appendix originates from the medial aspect of the cecum and demonstrates thickening with maximal proximal caliber of 11 mm and tapering down to approximately 8 mm distally. No significant surrounding inflammatory changes or evidence of focal abscess. No extraluminal air identified. Findings likely represent early appendicitis. No bowel obstruction identified. No free air. The liver, gallbladder, pancreas, spleen, adrenal glands and kidneys are within normal limits. No masses,  enlarged lymph nodes or hernias are seen. No vascular abnormalities identified. Bony structures are unremarkable. Visualized lung bases are unremarkable and show mild atelectasis in the right middle lobe. IMPRESSION: Enlarged appendix measuring 11 mm in greatest caliber. Although no significant surrounding inflammatory changes are seen, findings likely represent early appendicitis based on abnormal caliber of the appendix. Electronically Signed   By: GlAletta Edouard.D.   On: 09/19/2015 11:00    Medications / Allergies: per chart  Antibiotics: Anti-infectives    Start     Dose/Rate Route Frequency Ordered Stop   09/19/15 1130  piperacillin-tazobactam (ZOSYN) IVPB 3.375 g     3.375 g 100 mL/hr over 30 Minutes Intravenous  Once 09/19/15 1126 09/19/15 1225        Note: Portions of this report may have been transcribed using voice recognition software. Every effort was made to ensure accuracy; however, inadvertent computerized transcription errors may be present.   Any transcriptional errors that result from this process are unintentional.     StAdin HectorM.D., F.A.C.S. Gastrointestinal and Minimally Invasive Surgery [facility] [address] [place] [phone] / Paging   09/20/2015  CARE TEAM:  PCP: No primary care provider on file.  Outpatient Care Team: No care team member to display  Inpatient Treatment Team: Treatment Team: Attending Provider: MdNolon NationsMD; Consulting Physician: Md CcEdison PaceMD; Registered Nurse: DaMartyn Malay  RN; Technician: Theora Master, NT; Technician: April R Juarez, NT; Registered Nurse: Lucie Leather, RN; Registered Nurse: Milta Deiters, RN

## 2015-09-21 MED ORDER — SODIUM CHLORIDE 0.9 % IV SOLN: 250.0000 mL | INTRAVENOUS | Status: DC | PRN

## 2015-09-21 MED ORDER — SODIUM CHLORIDE 0.9 % IJ SOLN
3.0000 mL | Freq: Two times a day (BID) | INTRAMUSCULAR | Status: DC
  Administered 2015-09-23: 3 mL via INTRAVENOUS

## 2015-09-21 MED ORDER — SODIUM CHLORIDE 0.9 % IJ SOLN: 3.0000 mL | INTRAMUSCULAR | Status: DC | PRN

## 2015-09-21 NOTE — Progress Notes (Signed)
[facility]  [address] [place], [place] [address] Phone: [phone] FAX: [phone]   Zachary Butler [id number] [birth year]   Problem List:   Active Problems:   Gastroenteritis   1 Day Post-Op   Assessment  Improving = c/w viral gastroenteritis  Plan:  Adv diet  IVF bolus as ordered yesterday - d/w RN  Given the fact that he does not have significant abdominal pain off antibiotics and not requiring narcotics through the night, that argues against appendicitis.  He does have soreness in his right lower quadrant.  He also has it in the epigastric region and other areas.  Not localized.  It seems most consistent with gastritis to me.  If he has worsening abdominal pain or nausea vomiting or the pain becomes more focused in the right lower quadrant, consider diagnostic laparoscopy with appendectomy.  Less likely now.  Otherwise we will hold off for now since his numbers are worse and his abdominal exam is better.  His wife agree.  If he has worsening diarrhea that does not resolve these interactions, may require more aggressive workup and gastroenterology consultation.  Try and hold off for now since diarrhea gone w Pepto x 2.  Suspicion of other etiologies is not too high at this point.  -bowel regimen -VTE prophylaxis- SCDs, etc -mobilize as tolerated to help recovery  [name], M.D., F.A.C.S. Gastrointestinal and Minimally Invasive Surgery [facility] [address] [place], [place] [address] [phone] Main / Paging   09/21/2015  Subjective:  Starving.  Asking for food.  Tol liquids  No more diarrhea.  Mild abd soreness epigatritis w eating only - much better  Wife in room.  Never got IVF bolus  Objective:  Vital signs:  Filed Vitals:   09/20/15 0722 09/20/15 1550 09/20/15 2128 09/21/15 0502  BP: 123/75 120/75 126/72 123/76  Pulse: 53 56 60 55  Temp: 98.1 F  (36.7 C) 98 F (36.7 C) 97.5 F (36.4 C) 98.2 F (36.8 C)  TempSrc: Oral Oral Oral Oral  Resp: 18 16 18 17   Height:      Weight:      SpO2: 98% 99% 100% 98%    Last BM Date: 09/19/15  Intake/Output   Yesterday:  11/05 0701 - 11/06 0700 In: 6734 [P.O.:960; I.V.:600] Out: -  This shift:     Bowel function:  Flatus: y  BM: x1 yesterday  Drain: n/a  Physical Exam:  General: Pt awake/alert/oriented x4 in no acute distress Eyes: PERRL, normal EOM.  Sclera clear.  No icterus Neuro: CN II-XII intact w/o focal sensory/motor deficits. Lymph: No head/neck/groin lymphadenopathy Psych:  No delerium/psychosis/paranoia HENT: Normocephalic, Mucus membranes moist.  No thrush Neck: Supple, No tracheal deviation Chest: No chest wall pain w good excursion CV:  Pulses intact.  Regular rhythm MS: Normal AROM mjr joints.  No obvious deformity Abdomen: Soft.  Nondistended.  Minimally tender at epigastric.  No pain with cough or bed shake.  No guarding.  No evidence of peritonitis.  No incarcerated hernias. Ext:  SCDs BLE.  No mjr edema.  No cyanosis Skin: No petechiae / purpura  Results:   Labs: Results for orders placed or performed during the hospital encounter of 09/19/15 (from the past 48 hour(s))  Comprehensive metabolic panel     Status: Abnormal   Collection Time: 09/19/15  9:35 AM  Result Value Ref Range   Sodium 137 135 - 145 mmol/L  Potassium 3.3 (L) 3.5 - 5.1 mmol/L   Chloride 102 101 - 111 mmol/L   CO2 28 22 - 32 mmol/L   Glucose, Bld 116 (H) 65 - 99 mg/dL   BUN 9 6 - 20 mg/dL   Creatinine, Ser 0.88 0.61 - 1.24 mg/dL   Calcium 8.6 (L) 8.9 - 10.3 mg/dL   Total Protein 7.3 6.5 - 8.1 g/dL   Albumin 3.7 3.5 - 5.0 g/dL   AST 20 15 - 41 U/L   ALT 33 17 - 63 U/L   Alkaline Phosphatase 41 38 - 126 U/L   Total Bilirubin 0.6 0.3 - 1.2 mg/dL   GFR calc non Af Amer >60 >60 mL/min   GFR calc Af Amer >60 >60 mL/min    Comment: (NOTE) The eGFR has been calculated using the  CKD EPI equation. This calculation has not been validated in all clinical situations. eGFR's persistently <60 mL/min signify possible Chronic Kidney Disease.    Anion gap 7 5 - 15  CBC WITH DIFFERENTIAL     Status: None   Collection Time: 09/19/15  9:35 AM  Result Value Ref Range   WBC 4.9 4.0 - 10.5 K/uL   RBC 4.92 4.22 - 5.81 MIL/uL   Hemoglobin 13.5 13.0 - 17.0 g/dL   HCT 40.4 39.0 - 52.0 %   MCV 82.1 78.0 - 100.0 fL   MCH 27.4 26.0 - 34.0 pg   MCHC 33.4 30.0 - 36.0 g/dL   RDW 13.9 11.5 - 15.5 %   Platelets 160 150 - 400 K/uL   Neutrophils Relative % 56 %   Neutro Abs 2.7 1.7 - 7.7 K/uL   Lymphocytes Relative 26 %   Lymphs Abs 1.3 0.7 - 4.0 K/uL   Monocytes Relative 17 %   Monocytes Absolute 0.8 0.1 - 1.0 K/uL   Eosinophils Relative 1 %   Eosinophils Absolute 0.1 0.0 - 0.7 K/uL   Basophils Relative 0 %   Basophils Absolute 0.0 0.0 - 0.1 K/uL  Lipase, blood     Status: None   Collection Time: 09/19/15  9:35 AM  Result Value Ref Range   Lipase 22 11 - 51 U/L  C difficile quick scan w PCR reflex     Status: Abnormal   Collection Time: 09/19/15  4:46 PM  Result Value Ref Range   C Diff antigen INDETERMINATE (A) NEGATIVE   C Diff toxin INDETERMINATE (A) NEGATIVE   C Diff interpretation Results are indeterminate. See PCR results.   Clostridium Difficile by PCR     Status: None   Collection Time: 09/19/15  4:46 PM  Result Value Ref Range   Toxigenic C Difficile by pcr NEGATIVE NEGATIVE  Comprehensive metabolic panel     Status: Abnormal   Collection Time: 09/20/15  5:26 AM  Result Value Ref Range   Sodium 140 135 - 145 mmol/L   Potassium 3.9 3.5 - 5.1 mmol/L   Chloride 107 101 - 111 mmol/L   CO2 27 22 - 32 mmol/L   Glucose, Bld 106 (H) 65 - 99 mg/dL   BUN 6 6 - 20 mg/dL   Creatinine, Ser 0.74 0.61 - 1.24 mg/dL   Calcium 8.6 (L) 8.9 - 10.3 mg/dL   Total Protein 6.4 (L) 6.5 - 8.1 g/dL   Albumin 3.2 (L) 3.5 - 5.0 g/dL   AST 16 15 - 41 U/L   ALT 27 17 - 63 U/L    Alkaline Phosphatase 36 (L) 38 - 126 U/L  Total Bilirubin 0.5 0.3 - 1.2 mg/dL   GFR calc non Af Amer >60 >60 mL/min   GFR calc Af Amer >60 >60 mL/min    Comment: (NOTE) The eGFR has been calculated using the CKD EPI equation. This calculation has not been validated in all clinical situations. eGFR's persistently <60 mL/min signify possible Chronic Kidney Disease.    Anion gap 6 5 - 15  CBC     Status: Abnormal   Collection Time: 09/20/15  5:26 AM  Result Value Ref Range   WBC 4.7 4.0 - 10.5 K/uL   RBC 4.37 4.22 - 5.81 MIL/uL   Hemoglobin 11.9 (L) 13.0 - 17.0 g/dL   HCT 36.2 (L) 39.0 - 52.0 %   MCV 82.8 78.0 - 100.0 fL   MCH 27.2 26.0 - 34.0 pg   MCHC 32.9 30.0 - 36.0 g/dL   RDW 13.6 11.5 - 15.5 %   Platelets 138 (L) 150 - 400 K/uL    Imaging / Studies: Ct Abdomen Pelvis W Contrast  09/19/2015  CLINICAL DATA:  Right lower quadrant abdominal pain for 3 days with nausea and diarrhea. EXAM: CT ABDOMEN AND PELVIS WITH CONTRAST TECHNIQUE: Multidetector CT imaging of the abdomen and pelvis was performed using the standard protocol following bolus administration of intravenous contrast. CONTRAST:  30m OMNIPAQUE IOHEXOL 300 MG/ML SOLN, 1028mOMNIPAQUE IOHEXOL 300 MG/ML SOLN COMPARISON:  None. FINDINGS: The appendix originates from the medial aspect of the cecum and demonstrates thickening with maximal proximal caliber of 11 mm and tapering down to approximately 8 mm distally. No significant surrounding inflammatory changes or evidence of focal abscess. No extraluminal air identified. Findings likely represent early appendicitis. No bowel obstruction identified. No free air. The liver, gallbladder, pancreas, spleen, adrenal glands and kidneys are within normal limits. No masses, enlarged lymph nodes or hernias are seen. No vascular abnormalities identified. Bony structures are unremarkable. Visualized lung bases are unremarkable and show mild atelectasis in the right middle lobe. IMPRESSION:  Enlarged appendix measuring 11 mm in greatest caliber. Although no significant surrounding inflammatory changes are seen, findings likely represent early appendicitis based on abnormal caliber of the appendix. Electronically Signed   By: GlAletta Edouard.D.   On: 09/19/2015 11:00    Medications / Allergies: per chart  Antibiotics: Anti-infectives    Start     Dose/Rate Route Frequency Ordered Stop   09/19/15 1130  piperacillin-tazobactam (ZOSYN) IVPB 3.375 g     3.375 g 100 mL/hr over 30 Minutes Intravenous  Once 09/19/15 1126 09/19/15 1225        Note: Portions of this report may have been transcribed using voice recognition software. Every effort was made to ensure accuracy; however, inadvertent computerized transcription errors may be present.   Any transcriptional errors that result from this process are unintentional.     StAdin HectorM.D., F.A.C.S. Gastrointestinal and Minimally Invasive Surgery [facility] [address] [place] [id number] [phone] / Paging   09/21/2015  CARE TEAM:  PCP: No primary care provider on file.  Outpatient Care Team: No care team member to display  Inpatient Treatment Team: Treatment Team: Attending Provider: MdNolon NationsMD; Consulting Physician: Md CcEdison PaceMD; Registered Nurse: DaMartyn MalayRN; Technician: KiTheora MasterNT; Registered Nurse: PaMilta DeitersRN; Registered Nurse: WeBarrington EllisonRN

## 2015-09-22 LAB — CBC
HEMATOCRIT: 39.5 % (ref 39.0–52.0)
HEMOGLOBIN: 12.9 g/dL — AB (ref 13.0–17.0)
MCH: 27.3 pg (ref 26.0–34.0)
MCHC: 32.7 g/dL (ref 30.0–36.0)
MCV: 83.5 fL (ref 78.0–100.0)
Platelets: 200 10*3/uL (ref 150–400)
RBC: 4.73 MIL/uL (ref 4.22–5.81)
RDW: 13.3 % (ref 11.5–15.5)
WBC: 5.8 10*3/uL (ref 4.0–10.5)

## 2015-09-22 LAB — COMPREHENSIVE METABOLIC PANEL
ALK PHOS: 49 U/L (ref 38–126)
ALT: 46 U/L (ref 17–63)
AST: 31 U/L (ref 15–41)
Albumin: 3.7 g/dL (ref 3.5–5.0)
Anion gap: 4 — ABNORMAL LOW (ref 5–15)
BUN: 5 mg/dL — ABNORMAL LOW (ref 6–20)
CALCIUM: 8.7 mg/dL — AB (ref 8.9–10.3)
CO2: 30 mmol/L (ref 22–32)
CREATININE: 0.93 mg/dL (ref 0.61–1.24)
Chloride: 105 mmol/L (ref 101–111)
GFR calc non Af Amer: >60 mL/min (ref >60)
Glucose, Bld: 98 mg/dL (ref 65–99)
Potassium: 3.8 mmol/L (ref 3.5–5.1)
SODIUM: 139 mmol/L (ref 135–145)
Total Bilirubin: 0.5 mg/dL (ref 0.3–1.2)
Total Protein: 6.8 g/dL (ref 6.5–8.1)

## 2015-09-22 LAB — LIPASE, BLOOD: Lipase: 22 U/L (ref 11–51)

## 2015-09-22 MED ORDER — HEPARIN SODIUM (PORCINE) 5000 UNIT/ML IJ SOLN
5000.0000 [IU] | Freq: Three times a day (TID) | INTRAMUSCULAR | Status: DC
  Administered 2015-09-22 – 2015-09-23 (×3): 5000 [IU] via SUBCUTANEOUS
  Filled 2015-09-22 (×6): qty 1

## 2015-09-22 NOTE — Progress Notes (Signed)
2 Days Post-Op  Subjective: Had pain again yesterday with soft diet and wife wants to repeat CT scan and get him endoscopy.  Right now he is better, no nausea, vomiting or pain with full liquids or grits this AM.  As noted below he is afebrile, No BM since Sunday AM.  Objective: Vital signs in last 24 hours: Temp:  [98 F (36.7 C)-98.1 F (36.7 C)] 98.1 F (36.7 C) (11/07 0530) Pulse Rate:  [53-67] 53 (11/07 0530) Resp:  [16-18] 17 (11/07 0530) BP: (131-150)/(75-83) 142/83 mmHg (11/07 0530) SpO2:  [98 %-100 %] 99 % (11/07 0530) Last BM Date: 09/19/15 PO 840  Soft diet No BM's recorded Voided x 6 Afebrile, VSS No labs CT scan 09/19/15:  Enlarged appendix measuring 11 mm in greatest caliber. Although no significant surrounding inflammatory changes are seen, findings likely represent early appendicitis based on abnormal caliber of the appendix.  C diff is negative No growth on stool cultures so far Intake/Output from previous day: 11/06 0701 - 11/07 0700 In: 2840 [P.O.:840; IV Piggyback:2000] Out: -  Intake/Output this shift:    General appearance: alert, cooperative and no distress Resp: clear to auscultation bilaterally GI: soft, non-tender; bowel sounds normal; no masses,  no organomegaly  Lab Results:   Recent Labs  09/20/15 0526  WBC 4.7  HGB 11.9*  HCT 36.2*  PLT 138*    BMET  Recent Labs  09/20/15 0526  NA 140  K 3.9  CL 107  CO2 27  GLUCOSE 106*  BUN 6  CREATININE 0.74  CALCIUM 8.6*   PT/INR No results for input(s): LABPROT, INR in the last 72 hours.   Recent Labs Lab 09/19/15 0935 09/20/15 0526  AST 20 16  ALT 33 27  ALKPHOS 41 36*  BILITOT 0.6 0.5  PROT 7.3 6.4*  ALBUMIN 3.7 3.2*     Lipase     Component Value Date/Time   LIPASE 22 09/19/2015 0935     Studies/Results: No results found.  Medications: . lip balm  1 application Topical BID  . psyllium  1 packet Oral BID  . saccharomyces boulardii  250 mg Oral BID  . sodium  chloride  3 mL Intravenous Q12H  . sodium chloride  3 mL Intravenous Q12H    Assessment/Plan Appendicitis vs gastroenteritis OSA GERD Antibiotics:  None DVT:  SCD/Adding heparin back in    Plan:  I told the patient and wife we would try him back on a soft diet, and recheck his labs.  I think he is better.  She is very upset and anxious.       Errol Ala 09/22/2015

## 2015-09-23 LAB — CBC
HCT: 39.2 % (ref 39.0–52.0)
Hemoglobin: 13.2 g/dL (ref 13.0–17.0)
MCH: 28.1 pg (ref 26.0–34.0)
MCHC: 33.7 g/dL (ref 30.0–36.0)
MCV: 83.6 fL (ref 78.0–100.0)
PLATELETS: 206 10*3/uL (ref 150–400)
RBC: 4.69 MIL/uL (ref 4.22–5.81)
RDW: 13.5 % (ref 11.5–15.5)
WBC: 7.1 10*3/uL (ref 4.0–10.5)

## 2015-09-23 MED ORDER — SACCHAROMYCES BOULARDII 250 MG PO CAPS: 250.0000 mg | ORAL_CAPSULE | Freq: Two times a day (BID) | ORAL | Status: AC

## 2015-09-23 MED ORDER — PSYLLIUM 95 % PO PACK: 1.0000 | PACK | Freq: Two times a day (BID) | ORAL | Status: AC | PRN

## 2015-09-23 NOTE — Progress Notes (Signed)
Zachary Butler to be D/C'd Home per MD order.  Discussed prescriptions and follow up appointments with the patient. Prescriptions given to patient, medication list explained in detail. Pt verbalized understanding.    Medication List    TAKE these medications        ALKA SELTZER PLUS PO  Take 1 tablet by mouth daily as needed (stomach discomfort).     cyclobenzaprine 10 MG tablet  Commonly known as:  FLEXERIL  Take 10 mg by mouth 3 (three) times daily as needed for muscle spasms.     ergocalciferol 50000 UNITS capsule  Commonly known as:  VITAMIN D2  Take 50,000 Units by mouth once a week. Tuesday     methocarbamol 500 MG tablet  Commonly known as:  ROBAXIN  Take 1 tablet by mouth daily as needed. spasm     psyllium 95 % Pack  Commonly known as:  HYDROCIL/METAMUCIL  Take 1 packet by mouth 2 (two) times daily as needed for mild constipation.     saccharomyces boulardii 250 MG capsule  Commonly known as:  FLORASTOR  Take 1 capsule (250 mg total) by mouth 2 (two) times daily.        Filed Vitals:   09/23/15 0547  BP: 155/85  Pulse: 57  Temp: 97.8 F (36.6 C)  Resp: 18    Skin clean, dry and intact without evidence of skin break down, no evidence of skin tears noted. IV catheter discontinued intact. Site without signs and symptoms of complications. Dressing and pressure applied. Pt denies pain at this time. No complaints noted.  An After Visit Summary was printed and given to the patient. Patient escorted via WC, and D/C home via private auto.  Zachary Butler, Zachary Butler 09/23/2015 1:04 PM

## 2015-09-23 NOTE — Progress Notes (Signed)
[facility] Progress Note  3 Days Post-Op  Subjective: Feeling much better today. Tolerating regular diet well. No nausea or vomiting. He has had five episodes of non-bloody diarrhea since his symptoms started, three of them being since admission, his last episode was two days ago. No BMs in the last two days. Urinating without difficulty. Complains of mild headache. Wants to go home, requesting a work note.      Objective: Vital signs in last 24 hours: Temp:  [97.8 F (36.6 C)-98.1 F (36.7 C)] 97.8 F (36.6 C) (11/08 0547) Pulse Rate:  [57-59] 57 (11/08 0547) Resp:  [16-18] 18 (11/08 0547) BP: (144-155)/(76-85) 155/85 mmHg (11/08 0547) SpO2:  [97 %-99 %] 99 % (11/08 0547) Last BM Date: 09/19/15  Intake/Output from previous day: 11/07 0701 - 11/08 0700 In: 600 [P.O.:600] Out: -  Intake/Output this shift:    PE: Gen:  WDWN, alert and oriented, pleasant  Card:  RRR, normal S1 and S2, no M/G/R heard Pulm:  Lung sounds present and CTA b/l upper and lower, no W/R/R, no consolidations appreciated Abd:  Not distended, normoactive bowel sounds, firm due to positioning, NT, no HSM Ext:  Pulses strong upper and lower, no edema, erythema, or tenderness Labs: Stool cultures negative  Lab Results:   Recent Labs  09/22/15 1134 09/23/15 0555  WBC 5.8 7.1  HGB 12.9* 13.2  HCT 39.5 39.2  PLT 200 206   BMET  Recent Labs  09/22/15 1134  NA 139  K 3.8  CL 105  CO2 30  GLUCOSE 98  BUN 5*  CREATININE 0.93  CALCIUM 8.7*   PT/INR No results for input(s): LABPROT, INR in the last 72 hours. CMP     Component Value Date/Time   NA 139 09/22/2015 1134   K 3.8 09/22/2015 1134   CL 105 09/22/2015 1134   CO2 30 09/22/2015 1134   GLUCOSE 98 09/22/2015 1134   BUN 5* 09/22/2015 1134   CREATININE 0.93 09/22/2015 1134   CALCIUM 8.7* 09/22/2015 1134   PROT 6.8 09/22/2015 1134   ALBUMIN 3.7 09/22/2015 1134   AST 31 09/22/2015 1134   ALT 46 09/22/2015 1134   ALKPHOS  49 09/22/2015 1134   BILITOT 0.5 09/22/2015 1134   GFRNONAA >60 09/22/2015 1134   GFRAA >60 09/22/2015 1134   Lipase     Component Value Date/Time   LIPASE 22 09/22/2015 1134    Studies/Results: No results found.  Anti-infectives: Anti-infectives    Start     Dose/Rate Route Frequency Ordered Stop   09/19/15 1130  piperacillin-tazobactam (ZOSYN) IVPB 3.375 g     3.375 g 100 mL/hr over 30 Minutes Intravenous  Once 09/19/15 1126 09/19/15 1225       Assessment/Plan Gastroenteritis  - OK D/C today - Continue BRAT diet as discussed with patient. Advance diet at home as tolerated. - Rx Metamucil and Florastor 250mg  - Follow-up with PCP in one month  - Return if symptoms worsen   [employer]igroupWhitney Renay Crammer, [employer], PA-S 09/23/2015, 8:12 AM Office: [phone]

## 2015-09-23 NOTE — Discharge Instructions (Signed)
Viral Gastroenteritis °Viral gastroenteritis is also known as stomach flu. This condition affects the stomach and intestinal tract. It can cause sudden diarrhea and vomiting. The illness typically lasts 3 to 8 days. Most people develop an immune response that eventually gets rid of the virus. While this natural response develops, the virus can make you quite ill. °CAUSES  °Many different viruses can cause gastroenteritis, such as rotavirus or noroviruses. You can catch one of these viruses by consuming contaminated food or water. You may also catch a virus by sharing utensils or other personal items with an infected person or by touching a contaminated surface. °SYMPTOMS  °The most common symptoms are diarrhea and vomiting. These problems can cause a severe loss of body fluids (dehydration) and a body salt (electrolyte) imbalance. Other symptoms may include: °· Fever. °· Headache. °· Fatigue. °· Abdominal pain. °DIAGNOSIS  °Your caregiver can usually diagnose viral gastroenteritis based on your symptoms and a physical exam. A stool sample may also be taken to test for the presence of viruses or other infections. °TREATMENT  °This illness typically goes away on its own. Treatments are aimed at rehydration. The most serious cases of viral gastroenteritis involve vomiting so severely that you are not able to keep fluids down. In these cases, fluids must be given through an intravenous line (IV). °HOME CARE INSTRUCTIONS  °· Drink enough fluids to keep your urine clear or pale yellow. Drink small amounts of fluids frequently and increase the amounts as tolerated. °· Ask your caregiver for specific rehydration instructions. °· Avoid: °¨ Foods high in sugar. °¨ Alcohol. °¨ Carbonated drinks. °¨ Tobacco. °¨ Juice. °¨ Caffeine drinks. °¨ Extremely hot or cold fluids. °¨ Fatty, greasy foods. °¨ Too much intake of anything at one time. °¨ Dairy products until 24 to 48 hours after diarrhea stops. °· You may consume probiotics.  Probiotics are active cultures of beneficial bacteria. They may lessen the amount and number of diarrheal stools in adults. Probiotics can be found in yogurt with active cultures and in supplements. °· Wash your hands well to avoid spreading the virus. °· Only take over-the-counter or prescription medicines for pain, discomfort, or fever as directed by your caregiver. Do not give aspirin to children. Antidiarrheal medicines are not recommended. °· Ask your caregiver if you should continue to take your regular prescribed and over-the-counter medicines. °· Keep all follow-up appointments as directed by your caregiver. °SEEK IMMEDIATE MEDICAL CARE IF:  °· You are unable to keep fluids down. °· You do not urinate at least once every 6 to 8 hours. °· You develop shortness of breath. °· You notice blood in your stool or vomit. This may look like coffee grounds. °· You have abdominal pain that increases or is concentrated in one small area (localized). °· You have persistent vomiting or diarrhea. °· You have a fever. °· The patient is a child younger than 3 months, and he or she has a fever. °· The patient is a child older than 3 months, and he or she has a fever and persistent symptoms. °· The patient is a child older than 3 months, and he or she has a fever and symptoms suddenly get worse. °· The patient is a baby, and he or she has no tears when crying. °MAKE SURE YOU:  °· Understand these instructions. °· Will watch your condition. °· Will get help right away if you are not doing well or get worse. °  °This information is not intended to replace   advice given to you by your health care provider. Make sure you discuss any questions you have with your health care provider. °  °Document Released: 11/01/2005 Document Revised: 01/24/2012 Document Reviewed: 08/18/2011 °Elsevier Interactive Patient Education ©2016 Elsevier Inc. ° ° °Diarrhea °Diarrhea is frequent loose and watery bowel movements. It can cause you to feel weak  and dehydrated. Dehydration can cause you to become tired and thirsty, have a dry mouth, and have decreased urination that often is dark yellow. Diarrhea is a sign of another problem, most often an infection that will not last long. In most cases, diarrhea typically lasts 2-3 days. However, it can last longer if it is a sign of something more serious. It is important to treat your diarrhea as directed by your caregiver to lessen or prevent future episodes of diarrhea. °CAUSES  °Some common causes include: °· Gastrointestinal infections caused by viruses, bacteria, or parasites. °· Food poisoning or food allergies. °· Certain medicines, such as antibiotics, chemotherapy, and laxatives. °· Artificial sweeteners and fructose. °· Digestive disorders. °HOME CARE INSTRUCTIONS °· Ensure adequate fluid intake (hydration): Have 1 cup (8 oz) of fluid for each diarrhea episode. Avoid fluids that contain simple sugars or sports drinks, fruit juices, whole milk products, and sodas. Your urine should be clear or pale yellow if you are drinking enough fluids. Hydrate with an oral rehydration solution that you can purchase at pharmacies, retail stores, and online. You can prepare an oral rehydration solution at home by mixing the following ingredients together: °¨  - tsp table salt. °¨ ¾ tsp baking soda. °¨  tsp salt substitute containing potassium chloride. °¨ 1  tablespoons sugar. °¨ 1 L (34 oz) of water. °· Certain foods and beverages may increase the speed at which food moves through the gastrointestinal (GI) tract. These foods and beverages should be avoided and include: °¨ Caffeinated and alcoholic beverages. °¨ High-fiber foods, such as raw fruits and vegetables, nuts, seeds, and whole grain breads and cereals. °¨ Foods and beverages sweetened with sugar alcohols, such as xylitol, sorbitol, and mannitol. °· Some foods may be well tolerated and may help thicken stool including: °¨ Starchy foods, such as rice, toast, pasta,  low-sugar cereal, oatmeal, grits, baked potatoes, crackers, and bagels. °¨ Bananas. °¨ Applesauce. °· Add probiotic-rich foods to help increase healthy bacteria in the GI tract, such as yogurt and fermented milk products. °· Wash your hands well after each diarrhea episode. °· Only take over-the-counter or prescription medicines as directed by your caregiver. °· Take a warm bath to relieve any burning or pain from frequent diarrhea episodes. °SEEK IMMEDIATE MEDICAL CARE IF:  °· You are unable to keep fluids down. °· You have persistent vomiting. °· You have blood in your stool, or your stools are black and tarry. °· You do not urinate in 6-8 hours, or there is only a small amount of very dark urine. °· You have abdominal pain that increases or localizes. °· You have weakness, dizziness, confusion, or light-headedness. °· You have a severe headache. °· Your diarrhea gets worse or does not get better. °· You have a fever or persistent symptoms for more than 2-3 days. °· You have a fever and your symptoms suddenly get worse. °MAKE SURE YOU:  °· Understand these instructions. °· Will watch your condition. °· Will get help right away if you are not doing well or get worse. °  °This information is not intended to replace advice given to you by your health care provider.   Make sure you discuss any questions you have with your health care provider. °  °Document Released: 10/22/2002 Document Revised: 11/22/2014 Document Reviewed: 07/09/2012 °Elsevier Interactive Patient Education ©2016 Elsevier Inc. ° ° °

## 2015-09-23 NOTE — Discharge Summary (Signed)
   [facility] Discharge Summary   Patient ID: Zachary Butler MRN: [id number] DOB/AGE: Mar 24, 1961 54 y.o.  Admit date: 09/19/2015 Discharge date: 09/23/2015  Admitting Diagnosis: Gastroenteritis  Discharge Diagnosis Patient Active Problem List   Diagnosis Date Noted  . Gastroenteritis 09/19/2015    Consultants None  Imaging: CT ABDOMEN AND PELVIS WITH CONTRAST.  IMPRESSION: Enlarged appendix measuring 11 mm in greatest caliber. Although no significant surrounding inflammatory changes are seen, findings likely represent early appendicitis based on abnormal caliber of the appendix.  Procedures None.  Hospital Course:  Zachary Butler is a 54 year old black male who [facility] with generalized cramping abdominal pain, subjective fever, nausea and diarrhea. Workup showed WBC 4.9, K+ in low-normal range, CT scan showed findings concerning for early appendicitis.  There was also suspicion for gastroenteritis. Patient was admitted for overnight observation on clear liquids only. He was managed medically without any antibiotics.  Stool cultures came back negative.  He had five episodes of diarrhea since 11/1, the last episode being two days ago which was resolved with Pepto-Bismol. Over the next few days, his symptoms resolved therefore going along with diagnosis of gastroenteritis and against appendicitis. On HD4, the patient was voiding well, tolerating diet, ambulating well, pain well controlled, vital signs stable, and felt stable for discharge home.  BRAT diet was discussed with patient and he understands to advance diet slowly and as tolerated. Patient will follow up with PCP in one month and knows to call or return to the ED if his symptoms worsen/return.  He will take his FLMA paperwork to our office to be completed.        Medication List    TAKE these medications        ALKA SELTZER PLUS PO  Take 1 tablet by mouth daily as needed (stomach discomfort).      cyclobenzaprine 10 MG tablet  Commonly known as:  FLEXERIL  Take 10 mg by mouth 3 (three) times daily as needed for muscle spasms.     ergocalciferol 50000 UNITS capsule  Commonly known as:  VITAMIN D2  Take 50,000 Units by mouth once a week. Tuesday     methocarbamol 500 MG tablet  Commonly known as:  ROBAXIN  Take 1 tablet by mouth daily as needed. spasm     psyllium 95 % Pack  Commonly known as:  HYDROCIL/METAMUCIL  Take 1 packet by mouth 2 (two) times daily as needed for mild constipation.     saccharomyces boulardii 250 MG capsule  Commonly known as:  FLORASTOR  Take 1 capsule (250 mg total) by mouth 2 (two) times daily.         Follow-up Information    Follow up with [facility]. Schedule an appointment as soon as possible for a visit in 1 month.   Why:  For post-hospital follow up with a primary care provider within 1 month.      [facility] [place]   Specialty:  General Surgery   Why:  Please go to our office to drop of your work FLMA paperwork to be filled out.   Contact information:   [address] [place] [id number] [phone]      Signed: Angelena SoleWhitney Kingdavid Leinbach, [employer], PA-S 09/23/2015, 10:15 am Office: [phone]  09/23/2015, 3:52 PM

## 2015-09-25 ENCOUNTER — Other Ambulatory Visit: Payer: Self-pay | Admitting: General Surgery

## 2015-09-25 NOTE — Progress Notes (Signed)
Called last Pm and Pt has Shigella. Specimen Description STOOL   Special Requests Normal   Culture SHIGELLA SONNEI  Note: CRITICAL RESULT CALLED TO, READ BACK BY AND VERIFIED WITH: DR. BYERLY 09/24/15 AT 0900 BY MARHE  Performed at [facility]       Report Status PENDING   Organism ID, Bacteria SHIGELLA SONNEI   Resulting Agency [facility]    Culture & Susceptibility      SHIGELLA SONNEI     Antibiotic Sensitivity Microscan Status    AMPICILLIN Sensitive <=2 SENSITIVE Final    Method: MIC    CIPROFLOXACIN Sensitive <=0.25 SENSITIVE Final    Method: MIC    TRIMETH/SULFA Sensitive <=20 SENSITIVE Final    Method: MIC    Comments SHIGELLA SONNEI (MIC)    SHIGELLA SONNEI               Specimen Collected: 09/19/15 4:46 PM Last Resulted: 09/24/15 3:33 PM                   We contacted ID Dr. Luciana Axeomer and he said that since he was no longer symptomatic we do not need to treat at this time.  I talked to the patient and his wife, he is still having some cramps and not eating well, but no diarrhea.  Since he is still symptomatic I have ordered 5 days of Cipro 500 mg PO BID.  Our office will call it in  [facility]

## 2015-09-26 LAB — STOOL CULTURE: Special Requests: NORMAL

## 2015-09-26 NOTE — Discharge Summary (Signed)
Attestation signed by Almond LintFaera Byerly, MD at 09/25/2015 10:59 AM  Agree with above.     Expand All Collapse All     Addendum to D/C summary: CAlled last Pm and Pt has Shigella.  Specimen Description STOOL   Special Requests Normal   Culture SHIGELLA SONNEI  Note: CRITICAL RESULT CALLED TO, READ BACK BY AND VERIFIED WITH: DR. BYERLY 09/24/15 AT 0900 BY MARHE  Performed at [facility]       Report Status PENDING   Organism ID, Bacteria SHIGELLA SONNEI   Resulting Agency [facility]    Culture & Susceptibility      SHIGELLA SONNEI     Antibiotic Sensitivity Microscan Status    AMPICILLIN Sensitive <=2 SENSITIVE Final    Method: MIC    CIPROFLOXACIN Sensitive <=0.25 SENSITIVE Final    Method: MIC    TRIMETH/SULFA Sensitive <=20 SENSITIVE Final    Method: MIC    Comments SHIGELLA SONNEI (MIC)    SHIGELLA SONNEI               Specimen Collected: 09/19/15 4:46 PM Last Resulted: 09/24/15 3:33 PM                   We contacted ID Dr. Luciana Axeomer and he said that since he was no longer symptomatic we do not need to treat at this time. I talked to the patient and his wife, he is still having some cramps and not eating well, but no diarrhea. Since he is still symptomatic I have ordered 5 days of Cipro 500 mg PO BID. Our office will call it in [facility] and [facility] [place].       Cosigned by: Almond LintFaera Byerly, MD at 09/25/2015 10:59 AM  Revision History     Date/Time User Provider Type Action   09/25/2015 10:59 AM Almond LintFaera Byerly, MD Physician Cosign   09/25/2015 9:00 AM Sherrie GeorgeWillard Zebulon Gantt, PA-C Physician Assistant Sign

## 2016-07-05 IMAGING — CT CT ABD-PELV W/ CM
2 of 5 series · 16 of 46 positions shown, 18 images · IV contrast (APPLIED)
Comparison: None.

CLINICAL DATA: Right lower quadrant abdominal pain for 3 days with
nausea and diarrhea.

EXAM:
CT ABDOMEN AND PELVIS WITH CONTRAST
TECHNIQUE: Multidetector CT imaging of the abdomen and pelvis was performed
using the standard protocol following bolus administration of
intravenous contrast.
CONTRAST:  25mL OMNIPAQUE IOHEXOL 300 MG/ML SOLN, 100mL OMNIPAQUE
IOHEXOL 300 MG/ML SOLN

[Series 2: abd/pelvis 5.0 b31f · axial · 0.77mm/px · z∈[+784,+1208]mm · 13 of 97 slices shown, 15 images]
[im 6/97  soft-tissue]
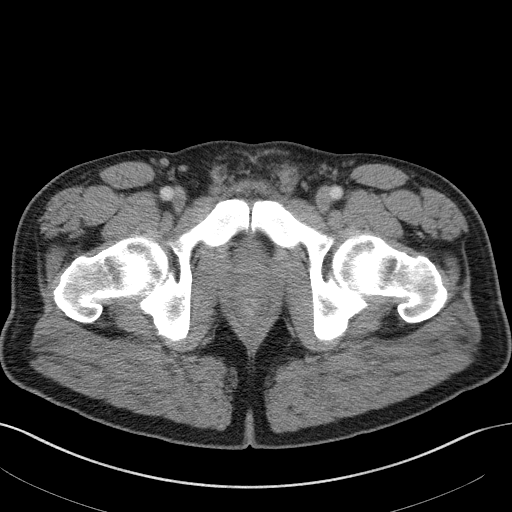
[im 6/97  bone]
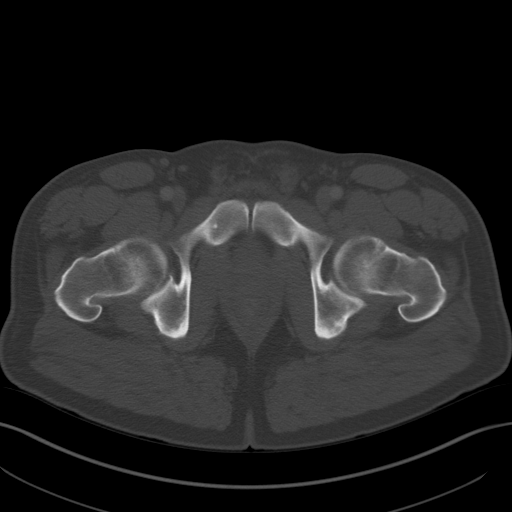
[im 11/97  soft-tissue]
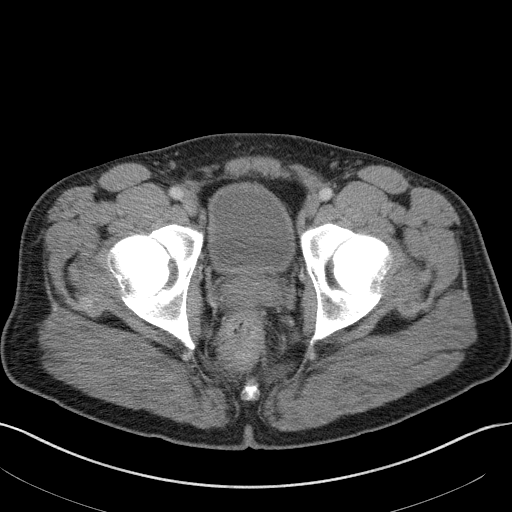
[im 22/97  soft-tissue]
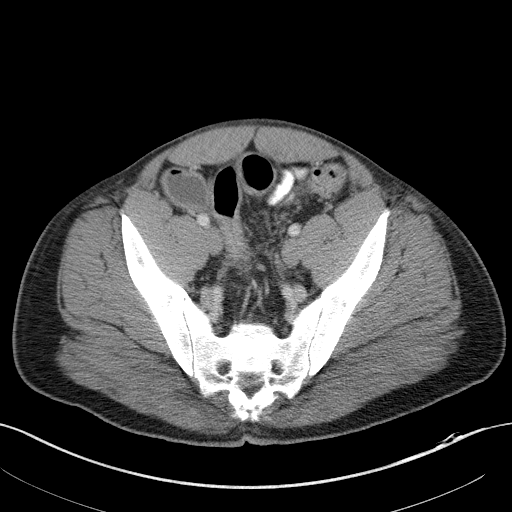
[im 27/97  soft-tissue]
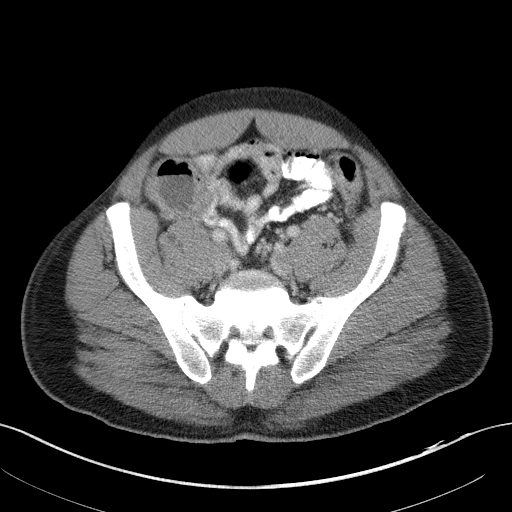
[im 33/97  soft-tissue]
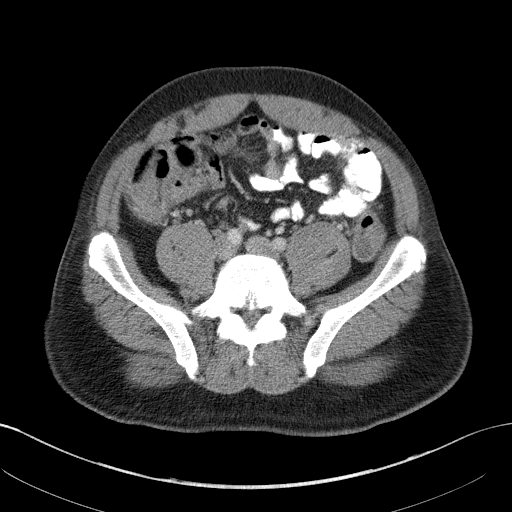
[im 43/97  soft-tissue]
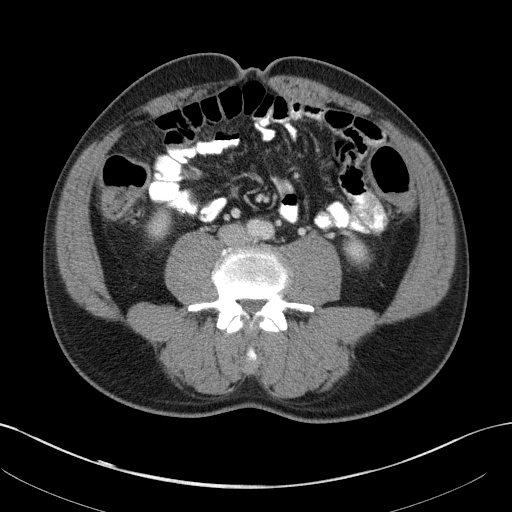
[im 49/97  soft-tissue]
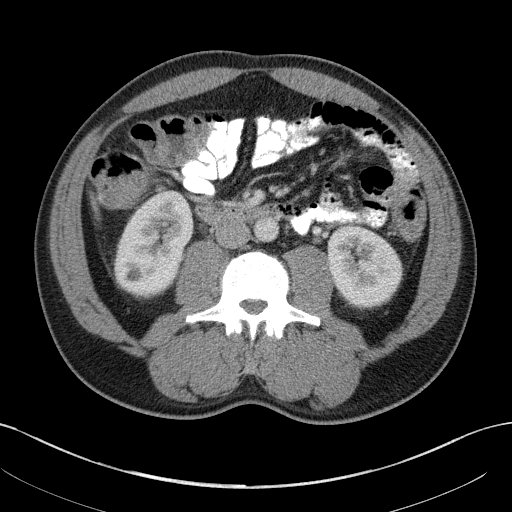
[im 54/97  soft-tissue]
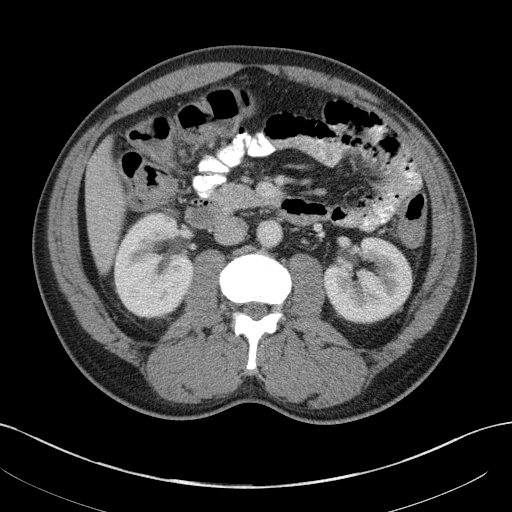
[im 65/97  soft-tissue]
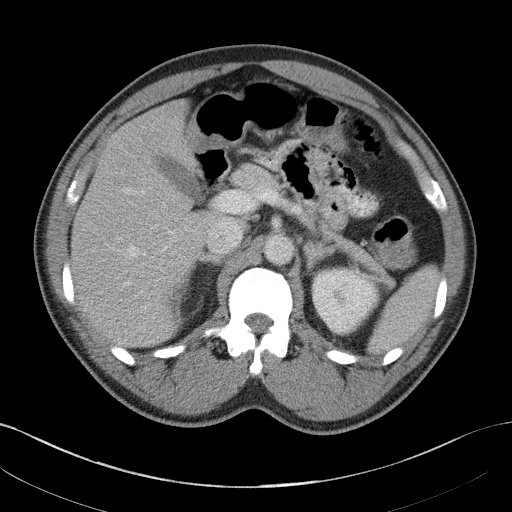
[im 65/97  bone]
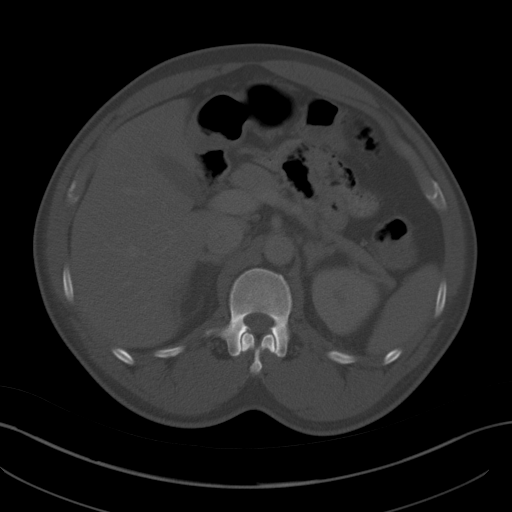
[im 70/97  soft-tissue]
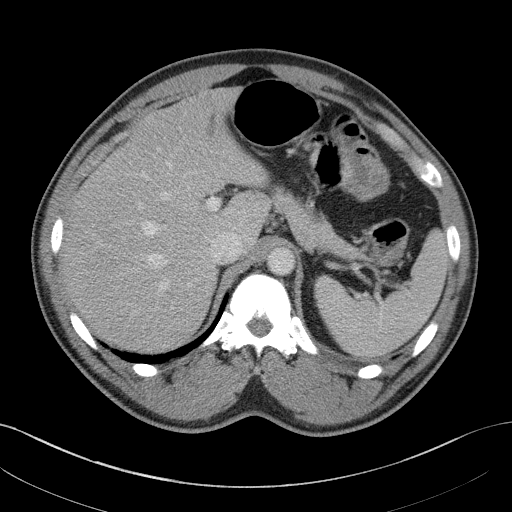
[im 75/97  soft-tissue]
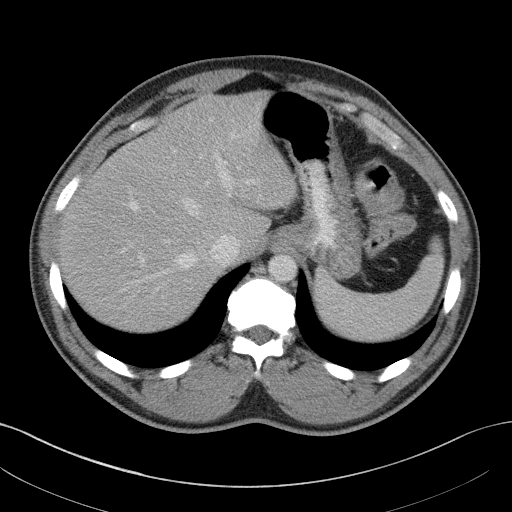
[im 86/97  soft-tissue]
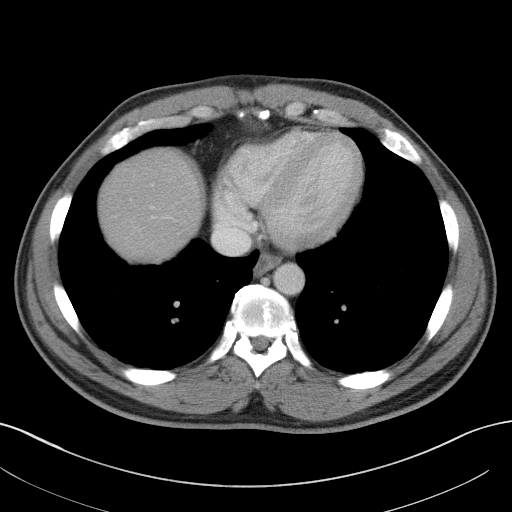
[im 91/97  soft-tissue]
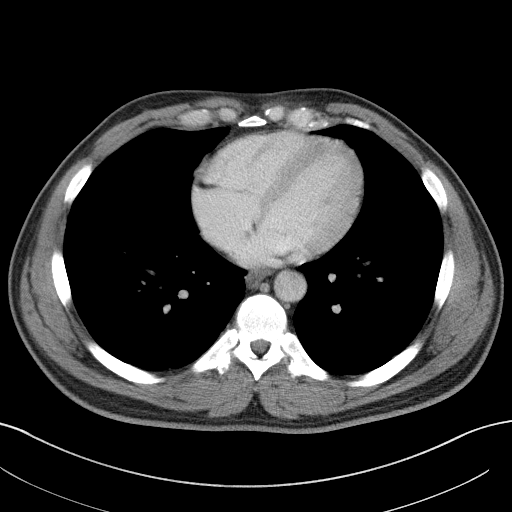

[Series 5: abd/pelvis 3.0 coronal · coronal · 0.80mm/px · 3 of 105 slices shown]
[im 35/105  soft-tissue]
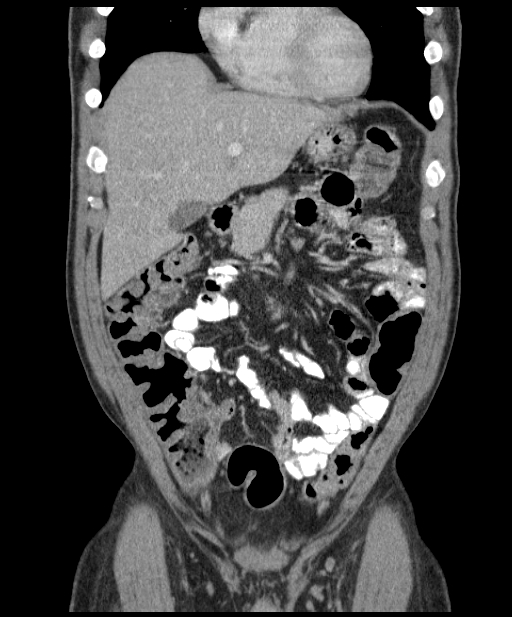
[im 47/105  soft-tissue]
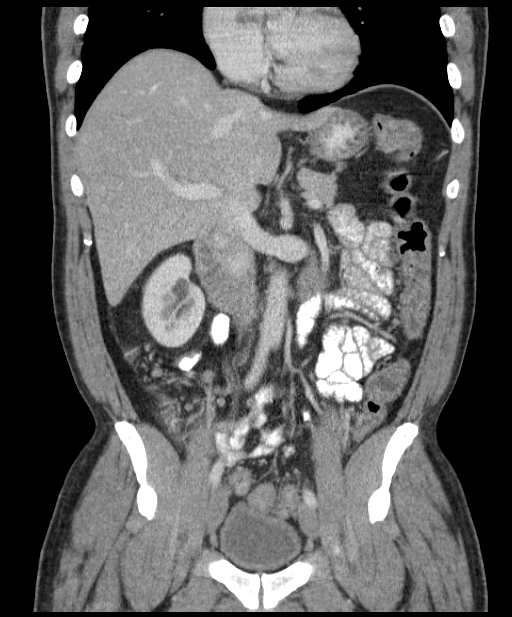
[im 58/105  soft-tissue]
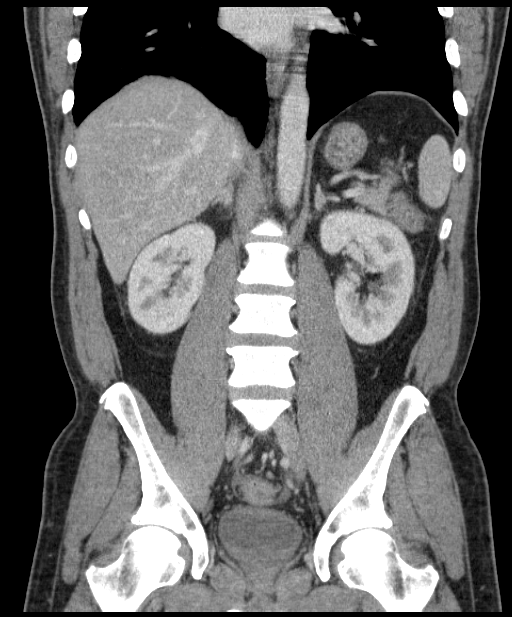

[16 of 46 positions shown; findings below may reference images not displayed]

FINDINGS: The appendix originates from the medial aspect of the cecum and
demonstrates thickening with maximal proximal caliber of 11 mm and
tapering down to approximately 8 mm distally. No significant
surrounding inflammatory changes or evidence of focal abscess. No
extraluminal air identified. Findings likely represent early
appendicitis.

No bowel obstruction identified. No free air. The liver,
gallbladder, pancreas, spleen, adrenal glands and kidneys are within
normal limits. No masses, enlarged lymph nodes or hernias are seen.
No vascular abnormalities identified. Bony structures are
unremarkable. Visualized lung bases are unremarkable and show mild
atelectasis in the right middle lobe.
IMPRESSION: Enlarged appendix measuring 11 mm in greatest caliber. Although no
significant surrounding inflammatory changes are seen, findings
likely represent early appendicitis based on abnormal caliber of the
appendix.

## 2018-11-20 ENCOUNTER — Encounter (HOSPITAL_COMMUNITY): Payer: Self-pay

## 2018-11-20 ENCOUNTER — Other Ambulatory Visit: Payer: Self-pay

## 2018-11-20 ENCOUNTER — Emergency Department (HOSPITAL_COMMUNITY)
Admission: EM | Admit: 2018-11-20 | Discharge: 2018-11-20 | Disposition: A | Discharge status: Home / Self Care | Payer: 59 | Attending: Emergency Medicine | Admitting: Emergency Medicine

## 2018-11-20 DIAGNOSIS — I10 Essential (primary) hypertension: Secondary | ICD-10-CM | POA: Diagnosis not present

## 2018-11-20 DIAGNOSIS — Z5321 Procedure and treatment not carried out due to patient leaving prior to being seen by health care provider: Secondary | ICD-10-CM | POA: Insufficient documentation

## 2018-11-20 HISTORY — DX: Shigellosis, unspecified: A03.9

## 2018-11-20 HISTORY — DX: Type 2 diabetes mellitus without complications: E11.9

## 2018-11-20 LAB — CBG MONITORING, ED: GLUCOSE-CAPILLARY: 113 mg/dL — AB (ref 70–99)

## 2018-11-20 NOTE — ED Triage Notes (Signed)
Patient c/o headache and hypertension. Patient states his BP was 180/106 at home.

## 2020-01-25 ENCOUNTER — Ambulatory Visit: Payer: PRIVATE HEALTH INSURANCE | Attending: Internal Medicine

## 2020-01-25 DIAGNOSIS — Z23 Encounter for immunization: Secondary | ICD-10-CM

## 2020-01-25 NOTE — Progress Notes (Signed)
   Covid-19 Vaccination Clinic  Name:  Zachary Butler    MRN: [id number] DOB: 1961-06-02  01/25/2020  Zachary Butler was observed post Covid-19 immunization for 15 minutes without incident. He was provided with Vaccine Information Sheet and instruction to access the V-Safe system.   Zachary Butler was instructed to call 911 with any severe reactions post vaccine: Marland Kitchen Difficulty breathing  . Swelling of face and throat  . A fast heartbeat  . A bad rash all over body  . Dizziness and weakness   Immunizations Administered    Name Date Dose VIS Date Route   Pfizer COVID-19 Vaccine 01/25/2020  2:29 PM 0.3 mL 10/26/2019 Intramuscular   Manufacturer: [employer], [employer]   Lot: [id number]   NDC: [address]-9

## 2020-02-19 ENCOUNTER — Ambulatory Visit: Payer: PRIVATE HEALTH INSURANCE | Attending: Internal Medicine

## 2020-02-19 DIAGNOSIS — Z23 Encounter for immunization: Secondary | ICD-10-CM

## 2020-02-19 NOTE — Progress Notes (Signed)
   Covid-19 Vaccination Clinic  Name:  Zachary Butler    MRN: [id number] DOB: 1961/04/26  02/19/2020  Mr. [name] was observed post Covid-19 immunization for 15 minutes without incident. He was provided with Vaccine Information Sheet and instruction to access the V-Safe system.   Mr. [name] was instructed to call 911 with any severe reactions post vaccine: Marland Kitchen Difficulty breathing  . Swelling of face and throat  . A fast heartbeat  . A bad rash all over body  . Dizziness and weakness   Immunizations Administered    Name Date Dose VIS Date Route   Pfizer COVID-19 Vaccine 02/19/2020  3:02 PM 0.3 mL 10/26/2019 Intramuscular   Manufacturer: [employer], [employer]   Lot: [id number]   NDC: [address]-8

## 2023-05-21 LAB — HEMOGLOBIN A1C: Hemoglobin A1C: 6.7

## 2023-05-21 LAB — AMB RESULTS CONSOLE CBG: Glucose: 136
